# Patient Record
Sex: Female | Born: 1965 | Race: White | Hispanic: Yes | Marital: Single | Smoking: Never smoker
Health system: Southern US, Community
[De-identification: ages and names within clinical notes are randomized; demographics above are authoritative.]

## PROBLEM LIST (undated history)

## (undated) DIAGNOSIS — R350 Frequency of micturition: Secondary | ICD-10-CM

## (undated) DIAGNOSIS — I10 Essential (primary) hypertension: Secondary | ICD-10-CM

## (undated) DIAGNOSIS — E119 Type 2 diabetes mellitus without complications: Secondary | ICD-10-CM

## (undated) DIAGNOSIS — D649 Anemia, unspecified: Secondary | ICD-10-CM

## (undated) DIAGNOSIS — F329 Major depressive disorder, single episode, unspecified: Secondary | ICD-10-CM

## (undated) DIAGNOSIS — F32A Depression, unspecified: Secondary | ICD-10-CM

## (undated) DIAGNOSIS — M1990 Unspecified osteoarthritis, unspecified site: Secondary | ICD-10-CM

## (undated) DIAGNOSIS — T7840XA Allergy, unspecified, initial encounter: Secondary | ICD-10-CM

## (undated) DIAGNOSIS — M2550 Pain in unspecified joint: Secondary | ICD-10-CM

## (undated) DIAGNOSIS — E785 Hyperlipidemia, unspecified: Secondary | ICD-10-CM

## (undated) HISTORY — PX: KNEE SURGERY: SHX244

## (undated) HISTORY — DX: Type 2 diabetes mellitus without complications: E11.9

## (undated) HISTORY — DX: Anemia, unspecified: D64.9

---

## 2009-10-13 ENCOUNTER — Ambulatory Visit: Payer: Self-pay | Admitting: Interventional Radiology

## 2009-10-13 ENCOUNTER — Emergency Department (HOSPITAL_BASED_OUTPATIENT_CLINIC_OR_DEPARTMENT_OTHER): Admission: EM | Admit: 2009-10-13 | Discharge: 2009-10-13 | Payer: Self-pay | Admitting: Emergency Medicine

## 2017-01-07 ENCOUNTER — Other Ambulatory Visit: Payer: Self-pay | Admitting: Orthopedic Surgery

## 2017-01-08 ENCOUNTER — Telehealth: Payer: Self-pay | Admitting: Surgery

## 2017-01-08 NOTE — Telephone Encounter (Signed)
Spoke with Loraine Leriche- WC Adjuster @ [facility] to confirm the patient WC information. Loraine Leriche told me that he is unaware that Dr. Myra Gianotti is assisting the surgery with Dr. Yevette Edwards therefore the patient's office visit here will not be covered. I called Dr. Marshell Levan office back and left a message for Eunice Blase, the office worker's comp coordinator to speak with Loraine Leriche to ensure that Dr. Myra Gianotti is listed as an assistant in this surgery.

## 2017-01-12 ENCOUNTER — Encounter: Payer: Self-pay | Admitting: Surgery

## 2017-01-13 ENCOUNTER — Ambulatory Visit (INDEPENDENT_AMBULATORY_CARE_PROVIDER_SITE_OTHER): Payer: Worker's Compensation | Admitting: Surgery

## 2017-01-13 ENCOUNTER — Encounter: Payer: Self-pay | Admitting: Surgery

## 2017-01-13 VITALS — BP 144/85 | HR 83 | Temp 98.2°F | Resp 18 | Ht 60.0 in | Wt 174.0 lb

## 2017-01-13 DIAGNOSIS — M5440 Lumbago with sciatica, unspecified side: Secondary | ICD-10-CM

## 2017-01-13 NOTE — Progress Notes (Signed)
[facility]  Patient name: Monique Clayton MRN: [id number] DOB: April 18, 1966 Sex: female   REFERRING PROVIDER:    Dr. Yevette Edwards   REASON FOR CONSULT:    Spine exposure  HISTORY OF PRESENT ILLNESS:   Monique Clayton is a 51 y.o. female, who is Here today for discussions regarding anterior exposure of the L5-S1 disc space for instrumentation.  Her history is provided by an interpreter as she is Spanish-speaking.  The patient is on major medical problem is diabetes which is without complication.  She also suffers from chronic anemia.  Her only abdominal surgery is a C-section 2.  PAST MEDICAL HISTORY    Past Medical History:  Diagnosis Date  . Anemia   . Diabetes mellitus without complication (HCC)      FAMILY HISTORY   No family history on file.  SOCIAL HISTORY:   Social History   Social History  . Marital status: Married    Spouse name: N/A  . Number of children: N/A  . Years of education: N/A   Occupational History  . Not on file.   Social History Main Topics  . Smoking status: Never Smoker  . Smokeless tobacco: Never Used  . Alcohol use No  . Drug use: No  . Sexual activity: Not on file   Other Topics Concern  . Not on file   Social History Narrative  . No narrative on file    ALLERGIES:    Allergies  Allergen Reactions  . Canagliflozin Other (See Comments)    Yeast Infection  . Penicillins Other (See Comments)    "Vaginal infection"    CURRENT MEDICATIONS:    Current Outpatient Prescriptions  Medication Sig Dispense Refill  . Dulaglutide (TRULICITY) 1.5 MG/0.5ML SOPN INJECT 1.5 MG UNDER THE SKIN ONCE WEEKLY FOR 30 DAYS    . glipiZIDE-metformin (METAGLIP) 2.5-500 MG tablet TAKE 2 TABLETS BY MOUTH EVERY MORNING AND TAKE 1 TABLET EVERY EVENING    . pioglitazone (ACTOS) 15 MG tablet Take 15 mg by mouth.    . sitaGLIPtin (JANUVIA) 100 MG tablet Take 100 mg by mouth.      No current facility-administered medications for this visit.     REVIEW OF SYSTEMS:    denotes positive finding,  denotes negative finding Cardiac  Comments:  Chest pain or chest pressure:    Shortness of breath upon exertion:    Short of breath when lying flat:    Irregular heart rhythm:        Vascular    Pain in calf, thigh, or hip brought on by ambulation: x   Pain in feet at night that wakes you up from your sleep:     Blood clot in your veins:    Leg swelling:         Pulmonary    Oxygen at home:    Productive cough:     Wheezing:         Neurologic    Sudden weakness in arms or legs:     Sudden numbness in arms or legs:     Sudden onset of difficulty speaking or slurred speech:    Temporary loss of vision in one eye:     Problems with dizziness:         Gastrointestinal    Blood in stool:      Vomited blood:         Genitourinary    Burning when urinating:     Blood in  urine:        Psychiatric    Major depression:         Hematologic    Bleeding problems:    Problems with blood clotting too easily:        Skin    Rashes or ulcers:        Constitutional    Fever or chills:     PHYSICAL EXAM:   Vitals:   01/13/17 1412  BP: (!) 144/85  Pulse: 83  Resp: 18  Temp: 98.2 F (36.8 C)  TempSrc: Oral  SpO2: 98%  Weight: 174 lb (78.9 kg)  Height: 5' (1.524 m)    GENERAL: The patient is a well-nourished female, in no acute distress. The vital signs are documented above. CARDIAC: There is a regular rate and rhythm.  VASCULAR: Palpable pedal pulses bilaterally.  No significant lower extremity edema PULMONARY: Nonlabored respirations ABDOMEN: Soft and non-tender   MUSCULOSKELETAL: There are no major deformities or cyanosis. NEUROLOGIC: No focal weakness or paresthesias are detected. SKIN: There are no ulcers or rashes noted. PSYCHIATRIC: The patient has a normal affect.  STUDIES:   I have reviewed the x-rays she brought with her of her  L-spine and sacrum  ASSESSMENT and PLAN   The patient is scheduled for anterior exposure for instrumentation of the L5-S1 disc space.  Via the Spanish interpreter, I discussed the details of the procedure as well as my involvement in her case.  We discussed the type of incision to be made as well as the potential risk for injury to the iliac artery, iliac vein, and ureter.  All of her questions were answered today and she is ready to proceed with surgery.   Durene Cal, MD [facility] [phone] Pager [phone]

## 2017-01-15 ENCOUNTER — Other Ambulatory Visit: Payer: Self-pay

## 2017-01-18 ENCOUNTER — Encounter: Payer: Self-pay | Admitting: Orthopedic Surgery

## 2017-01-21 ENCOUNTER — Encounter (HOSPITAL_COMMUNITY)
Admission: RE | Admit: 2017-01-21 | Discharge: 2017-01-21 | Disposition: A | Discharge status: Home / Self Care | Payer: Worker's Compensation | Source: Ambulatory Visit | Attending: Orthopedic Surgery | Admitting: Orthopedic Surgery

## 2017-01-21 ENCOUNTER — Encounter (HOSPITAL_COMMUNITY): Payer: Self-pay

## 2017-01-21 ENCOUNTER — Ambulatory Visit (HOSPITAL_COMMUNITY)
Admission: RE | Admit: 2017-01-21 | Discharge: 2017-01-21 | Disposition: A | Discharge status: Home / Self Care | Payer: Worker's Compensation | Source: Ambulatory Visit | Attending: Orthopedic Surgery | Admitting: Orthopedic Surgery

## 2017-01-21 DIAGNOSIS — I10 Essential (primary) hypertension: Secondary | ICD-10-CM | POA: Diagnosis not present

## 2017-01-21 DIAGNOSIS — Z01818 Encounter for other preprocedural examination: Secondary | ICD-10-CM

## 2017-01-21 DIAGNOSIS — E119 Type 2 diabetes mellitus without complications: Secondary | ICD-10-CM | POA: Diagnosis not present

## 2017-01-21 DIAGNOSIS — Z01810 Encounter for preprocedural cardiovascular examination: Secondary | ICD-10-CM | POA: Diagnosis present

## 2017-01-21 DIAGNOSIS — Z01812 Encounter for preprocedural laboratory examination: Secondary | ICD-10-CM | POA: Insufficient documentation

## 2017-01-21 DIAGNOSIS — E785 Hyperlipidemia, unspecified: Secondary | ICD-10-CM | POA: Diagnosis not present

## 2017-01-21 HISTORY — DX: Depression, unspecified: F32.A

## 2017-01-21 HISTORY — DX: Unspecified osteoarthritis, unspecified site: M19.90

## 2017-01-21 HISTORY — DX: Essential (primary) hypertension: I10

## 2017-01-21 HISTORY — DX: Pain in unspecified joint: M25.50

## 2017-01-21 HISTORY — DX: Allergy, unspecified, initial encounter: T78.40XA

## 2017-01-21 HISTORY — DX: Major depressive disorder, single episode, unspecified: F32.9

## 2017-01-21 HISTORY — DX: Frequency of micturition: R35.0

## 2017-01-21 HISTORY — DX: Hyperlipidemia, unspecified: E78.5

## 2017-01-21 LAB — URINALYSIS, ROUTINE W REFLEX MICROSCOPIC
BILIRUBIN URINE: NEGATIVE
Bacteria, UA: NONE SEEN
Glucose, UA: NEGATIVE mg/dL
Hgb urine dipstick: NEGATIVE
KETONES UR: NEGATIVE mg/dL
NITRITE: NEGATIVE
PH: 6 (ref 5.0–8.0)
PROTEIN: NEGATIVE mg/dL
Specific Gravity, Urine: 1.008 (ref 1.005–1.030)

## 2017-01-21 LAB — CBC WITH DIFFERENTIAL/PLATELET
Basophils Absolute: 0 10*3/uL (ref 0.0–0.1)
Basophils Relative: 0 %
EOS ABS: 0.4 10*3/uL (ref 0.0–0.7)
Eosinophils Relative: 4 %
HCT: 41.6 % (ref 36.0–46.0)
Hemoglobin: 14 g/dL (ref 12.0–15.0)
LYMPHS ABS: 3.5 10*3/uL (ref 0.7–4.0)
Lymphocytes Relative: 40 %
MCH: 30 pg (ref 26.0–34.0)
MCHC: 33.7 g/dL (ref 30.0–36.0)
MCV: 89.3 fL (ref 78.0–100.0)
MONO ABS: 0.7 10*3/uL (ref 0.1–1.0)
Monocytes Relative: 8 %
Neutro Abs: 4.1 10*3/uL (ref 1.7–7.7)
Neutrophils Relative %: 48 %
Platelets: 255 10*3/uL (ref 150–400)
RBC: 4.66 MIL/uL (ref 3.87–5.11)
RDW: 12.5 % (ref 11.5–15.5)
WBC: 8.7 10*3/uL (ref 4.0–10.5)

## 2017-01-21 LAB — SURGICAL PCR SCREEN
MRSA, PCR: NEGATIVE
Staphylococcus aureus: NEGATIVE

## 2017-01-21 LAB — TYPE AND SCREEN
ABO/RH(D): A POS
Antibody Screen: NEGATIVE

## 2017-01-21 LAB — COMPREHENSIVE METABOLIC PANEL
ALT: 73 U/L — AB (ref 14–54)
AST: 55 U/L — ABNORMAL HIGH (ref 15–41)
Albumin: 4.2 g/dL (ref 3.5–5.0)
Alkaline Phosphatase: 93 U/L (ref 38–126)
Anion gap: 8 (ref 5–15)
BUN: 14 mg/dL (ref 6–20)
CO2: 27 mmol/L (ref 22–32)
CREATININE: 0.62 mg/dL (ref 0.44–1.00)
Calcium: 9.9 mg/dL (ref 8.9–10.3)
Chloride: 102 mmol/L (ref 101–111)
Glucose, Bld: 148 mg/dL — ABNORMAL HIGH (ref 65–99)
Potassium: 3.8 mmol/L (ref 3.5–5.1)
SODIUM: 137 mmol/L (ref 135–145)
Total Bilirubin: 0.3 mg/dL (ref 0.3–1.2)
Total Protein: 8.1 g/dL (ref 6.5–8.1)

## 2017-01-21 LAB — ABO/RH: ABO/RH(D): A POS

## 2017-01-21 LAB — PROTIME-INR
INR: 1.04
PROTHROMBIN TIME: 13.6 s (ref 11.4–15.2)

## 2017-01-21 LAB — GLUCOSE, CAPILLARY: Glucose-Capillary: 159 mg/dL — ABNORMAL HIGH (ref 65–99)

## 2017-01-21 LAB — APTT: APTT: 31 s (ref 24–36)

## 2017-01-21 MED ORDER — POVIDONE-IODINE 7.5 % EX SOLN: Freq: Once | CUTANEOUS | Status: DC

## 2017-01-21 MED ORDER — CHLORHEXIDINE GLUCONATE 4 % EX LIQD
60.0000 mL | Freq: Once | CUTANEOUS | Status: DC
Start: 2017-01-21 — End: 2017-01-22

## 2017-01-21 MED ORDER — CHLORHEXIDINE GLUCONATE 4 % EX LIQD: 60.0000 mL | Freq: Once | CUTANEOUS | Status: DC

## 2017-01-21 NOTE — Pre-Procedure Instructions (Signed)
Monique Clayton  01/21/2017      Monique Clayton [facility] [place], [place] - [address] [place] [address] Phone: [phone] Fax: [phone]    Your procedure is scheduled on Wed, May 16 @ 8:30 AM  Report to [facility] Admitting at 6:30 AM  Call this number if you have problems the morning of surgery:  [phone]   Remember:  Do not eat food or drink liquids after midnight.              No Goody's,BC's,Aleve,Advil,Motrin,Ibuprofen,Aspirin,Fish Oil,or any Herbal Medication.     How to Manage Your Diabetes Before and After Surgery  Why is it important to control my blood sugar before and after surgery? . Improving blood sugar levels before and after surgery helps healing and can limit problems. . A way of improving blood sugar control is eating a healthy diet by: o  Eating less sugar and carbohydrates o  Increasing activity/exercise o  Talking with your doctor about reaching your blood sugar goals . High blood sugars (greater than 180 mg/dL) can raise your risk of infections and slow your recovery, so you will need to focus on controlling your diabetes during the weeks before surgery. . Make sure that the doctor who takes care of your diabetes knows about your planned surgery including the date and location.  How do I manage my blood sugar before surgery? . Check your blood sugar at least 4 times a day, starting 2 days before surgery, to make sure that the level is not too high or low. o Check your blood sugar the morning of your surgery when you wake up and every 2 hours until you get to the Short Stay unit. . If your blood sugar is less than 70 mg/dL, you will need to treat for low blood sugar: o Do not take insulin. o Treat a low blood sugar (less than 70 mg/dL) with  cup of clear juice (cranberry or apple), 4 glucose tablets, OR glucose gel. o Recheck blood sugar in 15 minutes after treatment (to make sure it is  greater than 70 mg/dL). If your blood sugar is not greater than 70 mg/dL on recheck, call [phone] for further instructions. . Report your blood sugar to the short stay nurse when you get to Short Stay.  . If you are admitted to the hospital after surgery: o Your blood sugar will be checked by the staff and you will probably be given insulin after surgery (instead of oral diabetes medicines) to make sure you have good blood sugar levels. o The goal for blood sugar control after surgery is 80-180 mg/dL.              WHAT DO I DO ABOUT MY DIABETES MEDICATION?   Marland Kitchen Do not take oral diabetes medicines (pills) the morning of surgery.    Reviewed and Endorsed by [facility] Patient Education Committee, August 2015   Do not wear jewelry, make-up or nail polish.  Do not wear lotions, powders, perfumes, or deoderant.  Do not shave 48 hours prior to surgery.   Do not bring valuables to the hospital.  [facility] is not responsible for any belongings or valuables.  Contacts, dentures or bridgework may not be worn into surgery.  Leave your suitcase in the car.  After surgery it may be brought to your room.  For patients admitted to the hospital, discharge time will be determined by your treatment team.  Patients discharged the day of surgery will not be allowed to drive home.    [facility]  Before surgery, you can play an important role.  Because skin is not sterile, your skin needs to be as free of germs as possible.  You can reduce the number of germs on you skin by washing with CHG (chlorahexidine gluconate) soap before surgery.  CHG is an antiseptic cleaner which kills germs and bonds with the skin to continue killing germs even after washing.  Please DO NOT use if you have an allergy to CHG or antibacterial soaps.  If your skin becomes reddened/irritated stop using the CHG and inform your nurse when you arrive at Short Stay.  Do  not shave (including legs and underarms) for at least 48 hours prior to the first CHG shower.  You may shave your face.  Please follow these instructions carefully:   1.  Shower with CHG Soap the night before surgery and the                                morning of Surgery.  2.  If you choose to wash your hair, wash your hair first as usual with your       normal shampoo.  3.  After you shampoo, rinse your hair and body thoroughly to remove the                      Shampoo.  4.  Use CHG as you would any other liquid soap.  You can apply chg directly       to the skin and wash gently with scrungie or a clean washcloth.  5.  Apply the CHG Soap to your body ONLY FROM THE NECK DOWN.        Do not use on open wounds or open sores.  Avoid contact with your eyes,       ears, mouth and genitals (private parts).  Wash genitals (private parts)       with your normal soap.  6.  Wash thoroughly, paying special attention to the area where your surgery        will be performed.  7.  Thoroughly rinse your body with warm water from the neck down.  8.  DO NOT shower/wash with your normal soap after using and rinsing off       the CHG Soap.  9.  Pat yourself dry with a clean towel.            10.  Wear clean pajamas.            11.  Place clean sheets on your bed the night of your first shower and do not        sleep with pets.  Day of Surgery  Do not apply any lotions/deoderants the morning of surgery.  Please wear clean clothes to the hospital/surgery center.   Please read over the following fact sheets that you were given. Pain Booklet, Coughing and Deep Breathing, MRSA Information and Surgical Site Infection Prevention

## 2017-01-21 NOTE — Progress Notes (Signed)
Cardiologist denies having one. States she did see one yrs ago d/t some chest pain.Cardiologist told her it was acid reflux  Medical Md is Dr.Kathleen Rice  Echo/stress test/heart cath denies  EKG denies in past yr  CXR denies in past yr

## 2017-01-22 LAB — HEMOGLOBIN A1C
HEMOGLOBIN A1C: 9.9 % — AB (ref 4.8–5.6)
MEAN PLASMA GLUCOSE: 237 mg/dL

## 2017-01-25 NOTE — H&P (Signed)
PREOPERATIVE H&P  Chief Complaint: Low back pain, bilateral leg pain  HPI: Monique Clayton is a 51 y.o. 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 Allergy    takes Zyrtec daily and uses Eye Drops  . Anemia   . Arthritis   . Depression    hx of-was on meds d/t death of dad in 2014  . Diabetes mellitus without complication (HCC)    takes Meds daily  . Hyperlipidemia    was on Lipitor but has been off for a yr  . Hypertension    was on Losartan but has been off for over a yr  . Joint pain   . Urinary frequency    Past Surgical History:  Procedure Laterality Date  . CESAREAN SECTION     x 2  . KNEE SURGERY Left    Social History   Social History  . Marital status: Single    Spouse name: N/A  . Number of children: N/A  . Years of education: N/A   Social History Main Topics  . Smoking status: Never Smoker  . Smokeless tobacco: Never Used  . Alcohol use No  . Drug use: No  . Sexual activity: Not on file   Other Topics Concern  . Not on file   Social History Narrative  . No narrative on file   No family history on file. Allergies  Allergen Reactions  . Canagliflozin Other (See Comments)    Yeast Infection  . Latex     Rash.Hand swelling  . Penicillins Other (See Comments)    "Vaginal infection"   Prior to Admission medications   Medication Sig Start Date End Date Taking? Authorizing Provider  azelastine (OPTIVAR) 0.05 % ophthalmic solution Place 1 drop into both eyes 2 (two) times daily.   Yes [provider]  Dulaglutide (TRULICITY) 1.5 MG/0.5ML SOPN INJECT 1.5 MG UNDER THE SKIN ONCE WEEKLY FOR 30 DAYS 09/25/16  Yes [provider]    glipiZIDE-metformin (METAGLIP) 2.5-500 MG tablet TAKE 2 TABLETS BY MOUTH EVERY MORNING AND TAKE 1 TABLET EVERY EVENING 12/18/16  Yes [provider]  ibuprofen (ADVIL,MOTRIN) 600 MG tablet Take 600 mg by mouth every 6 (six) hours as needed for moderate pain.   Yes [provider]  methocarbamol (ROBAXIN) 500 MG tablet Take 500 mg by mouth 2 (two) times daily as needed for muscle spasms.   Yes [provider]  montelukast (SINGULAIR) 10 MG tablet Take 10 mg by mouth at bedtime.   Yes [provider]  pioglitazone (ACTOS) 15 MG tablet Take 15 mg by mouth every morning.  12/04/16  Yes [provider]  sitaGLIPtin (JANUVIA) 100 MG tablet Take 100 mg by mouth daily.  06/09/16  Yes [provider]     All other systems have been reviewed and were otherwise negative with the exception of those mentioned in the HPI and as above.  Physical Exam: There were no vitals filed for this visit.  General: Alert, no acute distress Cardiovascular: No pedal edema Respiratory: No cyanosis, no use of accessory musculature Skin: No lesions in the area of chief complaint Neurologic: Sensation intact distally Psychiatric: Patient is competent  for consent with normal mood and affect Lymphatic: No axillary or cervical lymphadenopathy  MUSCULOSKELETAL: + TTP low back  Assessment/Plan: Lumbar radiculopathy Plan for Procedure(s): LUMBAR 5-SACRUM 1 ANTERIOR LUMBAR INTERBODY FUSION LUMBAR 5-SACRUM 1 POSTERIOR LUMBAR FUSION WITH INSTRUMENTATION AND ALLOGRAFT   Emilee Hero, MD 01/25/2017 7:53 AM

## 2017-01-26 NOTE — Anesthesia Preprocedure Evaluation (Addendum)
Anesthesia Evaluation  Patient identified by MRN, date of birth, ID band Patient awake    Reviewed: Allergy & Precautions, NPO status , Patient's Chart, lab work & pertinent test results  Airway Mallampati: II  TM Distance: >3 FB Neck ROM: Full    Dental  (+) Dental Advisory Given, Teeth Intact   Pulmonary neg pulmonary ROS,    Pulmonary exam normal        Cardiovascular hypertension, Pt. on medications  Rhythm:Regular Rate:Normal     Neuro/Psych negative neurological ROS     GI/Hepatic negative GI ROS, Neg liver ROS,   Endo/Other  diabetes, Poorly Controlled, Type 2, Oral Hypoglycemic Agents  Renal/GU      Musculoskeletal  (+) Arthritis ,   Abdominal   Peds  Hematology negative hematology ROS (+)   Anesthesia Other Findings   Reproductive/Obstetrics                           Lab Results  Component Value Date   WBC 8.7 01/21/2017   HGB 14.0 01/21/2017   HCT 41.6 01/21/2017   MCV 89.3 01/21/2017   PLT 255 01/21/2017   Lab Results  Component Value Date   CREATININE 0.62 01/21/2017   BUN 14 01/21/2017   NA 137 01/21/2017   K 3.8 01/21/2017   CL 102 01/21/2017   CO2 27 01/21/2017   Lab Results  Component Value Date   INR 1.04 01/21/2017    Anesthesia Physical Anesthesia Plan  ASA: II  Anesthesia Plan: General   Post-op Pain Management:    Induction: Intravenous  Airway Management Planned: Oral ETT  Additional Equipment: Arterial line  Intra-op Plan:   Post-operative Plan: Extubation in OR  Informed Consent: I have reviewed the patients History and Physical, chart, labs and discussed the procedure including the risks, benefits and alternatives for the proposed anesthesia with the patient or authorized representative who has indicated his/her understanding and acceptance.   Dental advisory given  Plan Discussed with: CRNA  Anesthesia Plan Comments:        Anesthesia Quick Evaluation

## 2017-01-26 NOTE — Progress Notes (Signed)
Anesthesia Chart Review:  Pt is a 51 year old female scheduled for L5-S1 ALIF, L5-S1 PLIF, abdominal exposure on 01/27/2017 with Estill BambergMark Dumonski, MD and Coral ElseVance Brabham, MD  - PCP is Montey HoraKathleen Rice, MD (notes in care everywhere)  PMH includes:  HTN, DM, hyperlipidemia, anemia. Never smoker. BMI 34  Medications include: dulaglutide, glipizide-metformin, pioglitazone, sitagliptin  Preoperative labs reviewed.  HbA1c 9.9, glucose 148.   - DM poorly controlled. A1c 9.3 on 12/04/16 and 8.7 on 08/15/16.   CXR 01/21/17: Negative.  No active cardiopulmonary disease.  EKG 01/21/17: NSR. Minimal voltage criteria for LVH, may be normal variant  I spoke with patient's PCP Dr. Dimple Caseyice by telephone about patient's uncontrolled DM. She felt increase in A1c is likely due to steroid injections patient has been receiving. She recommended the patient be placed on sliding scale insulin while in the hospital and receive diabetes education while in the hospital. She will follow up with the patient after discharge.  Rica Mastngela Rashay Barnette, FNP-BC [facility] Phone: [phone] 01/26/2017 5:06 PM

## 2017-01-27 ENCOUNTER — Inpatient Hospital Stay (HOSPITAL_COMMUNITY): Payer: Worker's Compensation

## 2017-01-27 ENCOUNTER — Inpatient Hospital Stay (HOSPITAL_COMMUNITY)
Admission: RE | Admit: 2017-01-27 | Discharge: 2017-01-29 | DRG: 460 | Disposition: A | Discharge status: Home / Self Care | Payer: Worker's Compensation | Source: Ambulatory Visit | Attending: Orthopedic Surgery | Admitting: Orthopedic Surgery

## 2017-01-27 ENCOUNTER — Inpatient Hospital Stay (HOSPITAL_COMMUNITY): Payer: Worker's Compensation | Admitting: Anesthesiology

## 2017-01-27 ENCOUNTER — Encounter (HOSPITAL_COMMUNITY): Payer: Self-pay | Admitting: Anesthesiology

## 2017-01-27 ENCOUNTER — Inpatient Hospital Stay (HOSPITAL_COMMUNITY)
Admission: RE | Disposition: A | Discharge status: Home / Self Care | Payer: Self-pay | Source: Ambulatory Visit | Attending: Orthopedic Surgery

## 2017-01-27 DIAGNOSIS — Z7984 Long term (current) use of oral hypoglycemic drugs: Secondary | ICD-10-CM

## 2017-01-27 DIAGNOSIS — M1990 Unspecified osteoarthritis, unspecified site: Secondary | ICD-10-CM | POA: Diagnosis present

## 2017-01-27 DIAGNOSIS — M5410 Radiculopathy, site unspecified: Secondary | ICD-10-CM | POA: Diagnosis present

## 2017-01-27 DIAGNOSIS — M4727 Other spondylosis with radiculopathy, lumbosacral region: Secondary | ICD-10-CM | POA: Diagnosis present

## 2017-01-27 DIAGNOSIS — D649 Anemia, unspecified: Secondary | ICD-10-CM | POA: Diagnosis present

## 2017-01-27 DIAGNOSIS — Z419 Encounter for procedure for purposes other than remedying health state, unspecified: Secondary | ICD-10-CM

## 2017-01-27 DIAGNOSIS — Z888 Allergy status to other drugs, medicaments and biological substances status: Secondary | ICD-10-CM

## 2017-01-27 DIAGNOSIS — Z79899 Other long term (current) drug therapy: Secondary | ICD-10-CM

## 2017-01-27 DIAGNOSIS — M79604 Pain in right leg: Secondary | ICD-10-CM | POA: Diagnosis present

## 2017-01-27 DIAGNOSIS — M5137 Other intervertebral disc degeneration, lumbosacral region: Secondary | ICD-10-CM

## 2017-01-27 DIAGNOSIS — Z91040 Latex allergy status: Secondary | ICD-10-CM

## 2017-01-27 DIAGNOSIS — E119 Type 2 diabetes mellitus without complications: Secondary | ICD-10-CM | POA: Diagnosis present

## 2017-01-27 DIAGNOSIS — M79605 Pain in left leg: Secondary | ICD-10-CM | POA: Diagnosis present

## 2017-01-27 HISTORY — PX: ABDOMINAL EXPOSURE: SHX5708

## 2017-01-27 HISTORY — PX: ANTERIOR LUMBAR FUSION: SHX1170

## 2017-01-27 LAB — GLUCOSE, CAPILLARY
GLUCOSE-CAPILLARY: 161 mg/dL — AB (ref 65–99)
GLUCOSE-CAPILLARY: 242 mg/dL — AB (ref 65–99)
GLUCOSE-CAPILLARY: 328 mg/dL — AB (ref 65–99)
Glucose-Capillary: 250 mg/dL — ABNORMAL HIGH (ref 65–99)

## 2017-01-27 SURGERY — ANTERIOR LUMBAR FUSION 1 LEVEL
Anesthesia: General

## 2017-01-27 MED ORDER — ONDANSETRON HCL 4 MG/2ML IJ SOLN
INTRAMUSCULAR | Status: AC
  Filled 2017-01-27: qty 2

## 2017-01-27 MED ORDER — FENTANYL CITRATE (PF) 250 MCG/5ML IJ SOLN
INTRAMUSCULAR | Status: AC
  Filled 2017-01-27: qty 5

## 2017-01-27 MED ORDER — MIDAZOLAM HCL 5 MG/5ML IJ SOLN
INTRAMUSCULAR | Status: DC | PRN
  Administered 2017-01-27: 2 mg via INTRAVENOUS

## 2017-01-27 MED ORDER — METFORMIN HCL 500 MG PO TABS: 500.0000 mg | ORAL_TABLET | Freq: Two times a day (BID) | ORAL | Status: DC

## 2017-01-27 MED ORDER — ACETAMINOPHEN 10 MG/ML IV SOLN
1000.0000 mg | Freq: Once | INTRAVENOUS | Status: AC
  Administered 2017-01-27: 1000 mg via INTRAVENOUS
  Filled 2017-01-27: qty 100

## 2017-01-27 MED ORDER — CEFAZOLIN SODIUM 1 G IJ SOLR
INTRAMUSCULAR | Status: AC
  Filled 2017-01-27: qty 20

## 2017-01-27 MED ORDER — DEXAMETHASONE SODIUM PHOSPHATE 10 MG/ML IJ SOLN
INTRAMUSCULAR | Status: DC | PRN
  Administered 2017-01-27: 10 mg via INTRAVENOUS

## 2017-01-27 MED ORDER — MIDAZOLAM HCL 2 MG/2ML IJ SOLN
INTRAMUSCULAR | Status: AC
  Filled 2017-01-27: qty 2

## 2017-01-27 MED ORDER — OXYCODONE HCL 5 MG PO TABS
5.0000 mg | ORAL_TABLET | Freq: Once | ORAL | Status: AC
  Administered 2017-01-27: 5 mg via ORAL

## 2017-01-27 MED ORDER — INSULIN ASPART 100 UNIT/ML ~~LOC~~ SOLN
0.0000 [IU] | Freq: Three times a day (TID) | SUBCUTANEOUS | Status: DC
  Administered 2017-01-28 (×2): 5 [IU] via SUBCUTANEOUS
  Administered 2017-01-28 – 2017-01-29 (×3): 3 [IU] via SUBCUTANEOUS

## 2017-01-27 MED ORDER — METFORMIN HCL 500 MG PO TABS
1000.0000 mg | ORAL_TABLET | Freq: Every day | ORAL | Status: DC
  Administered 2017-01-28 – 2017-01-29 (×2): 1000 mg via ORAL
  Filled 2017-01-27 (×2): qty 2

## 2017-01-27 MED ORDER — HYDROMORPHONE HCL 1 MG/ML IJ SOLN
0.2500 mg | INTRAMUSCULAR | Status: DC | PRN
  Administered 2017-01-27 (×3): 0.5 mg via INTRAVENOUS

## 2017-01-27 MED ORDER — GLIPIZIDE 2.5 MG HALF TABLET
2.5000 mg | ORAL_TABLET | Freq: Two times a day (BID) | ORAL | Status: DC
  Filled 2017-01-27: qty 2

## 2017-01-27 MED ORDER — THROMBIN 20000 UNITS EX KIT
PACK | CUTANEOUS | Status: DC | PRN
  Administered 2017-01-27: 20000 [IU] via TOPICAL

## 2017-01-27 MED ORDER — INSULIN ASPART 100 UNIT/ML ~~LOC~~ SOLN
SUBCUTANEOUS | Status: AC
  Administered 2017-01-27: 5 [IU]
  Filled 2017-01-27: qty 1

## 2017-01-27 MED ORDER — LIDOCAINE HCL (CARDIAC) 20 MG/ML IV SOLN
INTRAVENOUS | Status: DC | PRN
  Administered 2017-01-27: 60 mg via INTRAVENOUS

## 2017-01-27 MED ORDER — CEFAZOLIN SODIUM-DEXTROSE 2-4 GM/100ML-% IV SOLN
2.0000 g | Freq: Three times a day (TID) | INTRAVENOUS | Status: AC
  Administered 2017-01-27 – 2017-01-28 (×2): 2 g via INTRAVENOUS
  Filled 2017-01-27 (×2): qty 100

## 2017-01-27 MED ORDER — SUGAMMADEX SODIUM 200 MG/2ML IV SOLN
INTRAVENOUS | Status: DC | PRN
  Administered 2017-01-27: 150 mg via INTRAVENOUS

## 2017-01-27 MED ORDER — THROMBIN 20000 UNITS EX SOLR
CUTANEOUS | Status: AC
  Filled 2017-01-27: qty 20000

## 2017-01-27 MED ORDER — HYDROXYZINE HCL 50 MG/ML IM SOLN
50.0000 mg | Freq: Once | INTRAMUSCULAR | Status: AC
  Administered 2017-01-27: 50 mg via INTRAMUSCULAR
  Filled 2017-01-27: qty 1

## 2017-01-27 MED ORDER — LACTATED RINGERS IV SOLN
INTRAVENOUS | Status: DC | PRN
  Administered 2017-01-27 (×3): via INTRAVENOUS

## 2017-01-27 MED ORDER — PANTOPRAZOLE SODIUM 40 MG IV SOLR
40.0000 mg | Freq: Every day | INTRAVENOUS | Status: DC
  Administered 2017-01-27: 40 mg via INTRAVENOUS
  Filled 2017-01-27 (×2): qty 40

## 2017-01-27 MED ORDER — OXYCODONE HCL 5 MG PO TABS
ORAL_TABLET | ORAL | Status: AC
  Administered 2017-01-27: 5 mg
  Filled 2017-01-27: qty 1

## 2017-01-27 MED ORDER — ALUM & MAG HYDROXIDE-SIMETH 200-200-20 MG/5ML PO SUSP: 30.0000 mL | Freq: Four times a day (QID) | ORAL | Status: DC | PRN

## 2017-01-27 MED ORDER — GLIPIZIDE 5 MG PO TABS
5.0000 mg | ORAL_TABLET | Freq: Every day | ORAL | Status: DC
  Filled 2017-01-27 (×2): qty 1

## 2017-01-27 MED ORDER — HEMOSTATIC AGENTS (NO CHARGE) OPTIME
TOPICAL | Status: DC | PRN
  Administered 2017-01-27: 1 via TOPICAL

## 2017-01-27 MED ORDER — LACTATED RINGERS IV SOLN
INTRAVENOUS | Status: DC | PRN
  Administered 2017-01-27 (×2): via INTRAVENOUS

## 2017-01-27 MED ORDER — ROCURONIUM BROMIDE 100 MG/10ML IV SOLN
INTRAVENOUS | Status: DC | PRN
  Administered 2017-01-27: 20 mg via INTRAVENOUS
  Administered 2017-01-27 (×2): 10 mg via INTRAVENOUS
  Administered 2017-01-27 (×2): 20 mg via INTRAVENOUS
  Administered 2017-01-27: 50 mg via INTRAVENOUS
  Administered 2017-01-27: 20 mg via INTRAVENOUS

## 2017-01-27 MED ORDER — KETOTIFEN FUMARATE 0.025 % OP SOLN
1.0000 [drp] | Freq: Two times a day (BID) | OPHTHALMIC | Status: DC
  Filled 2017-01-27: qty 5

## 2017-01-27 MED ORDER — MENTHOL 3 MG MT LOZG: 1.0000 | LOZENGE | OROMUCOSAL | Status: DC | PRN

## 2017-01-27 MED ORDER — ACETAMINOPHEN 325 MG PO TABS: 650.0000 mg | ORAL_TABLET | ORAL | Status: DC | PRN

## 2017-01-27 MED ORDER — HYDROMORPHONE HCL 1 MG/ML IJ SOLN
INTRAMUSCULAR | Status: AC
  Filled 2017-01-27: qty 0.5

## 2017-01-27 MED ORDER — MORPHINE SULFATE (PF) 2 MG/ML IV SOLN: 1.0000 mg | INTRAVENOUS | Status: DC | PRN

## 2017-01-27 MED ORDER — LIDOCAINE 2% (20 MG/ML) 5 ML SYRINGE
INTRAMUSCULAR | Status: AC
  Filled 2017-01-27: qty 5

## 2017-01-27 MED ORDER — PHENYLEPHRINE HCL 10 MG/ML IJ SOLN
INTRAMUSCULAR | Status: DC | PRN
  Administered 2017-01-27 (×4): 40 ug via INTRAVENOUS
  Administered 2017-01-27: 80 ug via INTRAVENOUS
  Administered 2017-01-27 (×4): 40 ug via INTRAVENOUS

## 2017-01-27 MED ORDER — PHENYLEPHRINE 40 MCG/ML (10ML) SYRINGE FOR IV PUSH (FOR BLOOD PRESSURE SUPPORT)
PREFILLED_SYRINGE | INTRAVENOUS | Status: AC
  Filled 2017-01-27: qty 10

## 2017-01-27 MED ORDER — DULAGLUTIDE 1.5 MG/0.5ML ~~LOC~~ SOAJ: Freq: Every day | SUBCUTANEOUS | Status: DC

## 2017-01-27 MED ORDER — SUGAMMADEX SODIUM 200 MG/2ML IV SOLN
INTRAVENOUS | Status: AC
  Filled 2017-01-27: qty 2

## 2017-01-27 MED ORDER — OXYCODONE-ACETAMINOPHEN 5-325 MG PO TABS
1.0000 | ORAL_TABLET | ORAL | Status: DC | PRN
  Administered 2017-01-27: 1 via ORAL
  Administered 2017-01-28 (×2): 2 via ORAL
  Administered 2017-01-28: 1 via ORAL
  Administered 2017-01-28: 2 via ORAL
  Administered 2017-01-28: 1 via ORAL
  Administered 2017-01-29 (×2): 2 via ORAL
  Filled 2017-01-27 (×3): qty 2
  Filled 2017-01-27: qty 1
  Filled 2017-01-27 (×2): qty 2
  Filled 2017-01-27 (×2): qty 1

## 2017-01-27 MED ORDER — ZOLPIDEM TARTRATE 5 MG PO TABS: 5.0000 mg | ORAL_TABLET | Freq: Every evening | ORAL | Status: DC | PRN

## 2017-01-27 MED ORDER — DEXAMETHASONE SODIUM PHOSPHATE 10 MG/ML IJ SOLN
INTRAMUSCULAR | Status: AC
  Filled 2017-01-27: qty 1

## 2017-01-27 MED ORDER — SODIUM CHLORIDE 0.9% FLUSH: 3.0000 mL | INTRAVENOUS | Status: DC | PRN

## 2017-01-27 MED ORDER — MONTELUKAST SODIUM 10 MG PO TABS
10.0000 mg | ORAL_TABLET | Freq: Every day | ORAL | Status: DC
  Administered 2017-01-28 (×2): 10 mg via ORAL
  Filled 2017-01-27 (×2): qty 1

## 2017-01-27 MED ORDER — BUPIVACAINE LIPOSOME 1.3 % IJ SUSP
20.0000 mL | INTRAMUSCULAR | Status: DC
  Filled 2017-01-27: qty 20

## 2017-01-27 MED ORDER — PROPOFOL 10 MG/ML IV BOLUS
INTRAVENOUS | Status: AC
  Filled 2017-01-27: qty 20

## 2017-01-27 MED ORDER — PROPOFOL 10 MG/ML IV BOLUS
INTRAVENOUS | Status: DC | PRN
  Administered 2017-01-27: 150 mg via INTRAVENOUS

## 2017-01-27 MED ORDER — PHENOL 1.4 % MT LIQD: 1.0000 | OROMUCOSAL | Status: DC | PRN

## 2017-01-27 MED ORDER — INSULIN ASPART 100 UNIT/ML ~~LOC~~ SOLN
0.0000 [IU] | Freq: Three times a day (TID) | SUBCUTANEOUS | Status: DC
  Administered 2017-01-27: 11 [IU] via SUBCUTANEOUS

## 2017-01-27 MED ORDER — POTASSIUM CHLORIDE IN NACL 20-0.9 MEQ/L-% IV SOLN: INTRAVENOUS | Status: DC

## 2017-01-27 MED ORDER — BISACODYL 5 MG PO TBEC: 5.0000 mg | DELAYED_RELEASE_TABLET | Freq: Every day | ORAL | Status: DC | PRN

## 2017-01-27 MED ORDER — HYDROMORPHONE HCL 1 MG/ML IJ SOLN
INTRAMUSCULAR | Status: AC
  Filled 2017-01-27: qty 1

## 2017-01-27 MED ORDER — BUPIVACAINE LIPOSOME 1.3 % IJ SUSP
INTRAMUSCULAR | Status: DC | PRN
  Administered 2017-01-27: 20 mL

## 2017-01-27 MED ORDER — INSULIN ASPART 100 UNIT/ML ~~LOC~~ SOLN: 5.0000 [IU] | Freq: Once | SUBCUTANEOUS | Status: DC

## 2017-01-27 MED ORDER — PROMETHAZINE HCL 25 MG/ML IJ SOLN: 6.2500 mg | INTRAMUSCULAR | Status: DC | PRN

## 2017-01-27 MED ORDER — MORPHINE SULFATE (PF) 4 MG/ML IV SOLN: 1.0000 mg | INTRAVENOUS | Status: DC | PRN

## 2017-01-27 MED ORDER — BUPIVACAINE-EPINEPHRINE 0.25% -1:200000 IJ SOLN
INTRAMUSCULAR | Status: DC | PRN
  Administered 2017-01-27: 20 mL

## 2017-01-27 MED ORDER — CEFAZOLIN SODIUM-DEXTROSE 2-4 GM/100ML-% IV SOLN
2.0000 g | INTRAVENOUS | Status: AC
  Administered 2017-01-27 (×2): 2 g via INTRAVENOUS

## 2017-01-27 MED ORDER — PIOGLITAZONE HCL 15 MG PO TABS
15.0000 mg | ORAL_TABLET | ORAL | Status: DC
  Administered 2017-01-28 – 2017-01-29 (×2): 15 mg via ORAL
  Filled 2017-01-27 (×2): qty 1

## 2017-01-27 MED ORDER — SODIUM CHLORIDE 0.9% FLUSH
3.0000 mL | Freq: Two times a day (BID) | INTRAVENOUS | Status: DC
  Administered 2017-01-27: 3 mL via INTRAVENOUS

## 2017-01-27 MED ORDER — EPINEPHRINE PF 1 MG/ML IJ SOLN
INTRAMUSCULAR | Status: AC
  Filled 2017-01-27: qty 1

## 2017-01-27 MED ORDER — SENNOSIDES-DOCUSATE SODIUM 8.6-50 MG PO TABS: 1.0000 | ORAL_TABLET | Freq: Every evening | ORAL | Status: DC | PRN

## 2017-01-27 MED ORDER — DOCUSATE SODIUM 100 MG PO CAPS
100.0000 mg | ORAL_CAPSULE | Freq: Two times a day (BID) | ORAL | Status: DC
  Administered 2017-01-27 – 2017-01-29 (×4): 100 mg via ORAL
  Filled 2017-01-27 (×4): qty 1

## 2017-01-27 MED ORDER — ONDANSETRON HCL 4 MG PO TABS: 4.0000 mg | ORAL_TABLET | Freq: Four times a day (QID) | ORAL | Status: DC | PRN

## 2017-01-27 MED ORDER — ONDANSETRON HCL 4 MG/2ML IJ SOLN
4.0000 mg | Freq: Four times a day (QID) | INTRAMUSCULAR | Status: DC | PRN
  Administered 2017-01-27: 4 mg via INTRAVENOUS
  Filled 2017-01-27: qty 2

## 2017-01-27 MED ORDER — DIAZEPAM 5 MG PO TABS
5.0000 mg | ORAL_TABLET | Freq: Four times a day (QID) | ORAL | Status: DC | PRN
  Administered 2017-01-28 – 2017-01-29 (×5): 5 mg via ORAL
  Filled 2017-01-27 (×5): qty 1

## 2017-01-27 MED ORDER — 0.9 % SODIUM CHLORIDE (POUR BTL) OPTIME
TOPICAL | Status: DC | PRN
  Administered 2017-01-27 (×3): 1000 mL

## 2017-01-27 MED ORDER — PROPOFOL 500 MG/50ML IV EMUL
INTRAVENOUS | Status: DC | PRN
  Administered 2017-01-27: 11:00:00 via INTRAVENOUS
  Administered 2017-01-27: 75 ug/kg/min via INTRAVENOUS

## 2017-01-27 MED ORDER — ACETAMINOPHEN 650 MG RE SUPP: 650.0000 mg | RECTAL | Status: DC | PRN

## 2017-01-27 MED ORDER — FENTANYL CITRATE (PF) 100 MCG/2ML IJ SOLN
INTRAMUSCULAR | Status: DC | PRN
  Administered 2017-01-27 (×7): 50 ug via INTRAVENOUS
  Administered 2017-01-27: 100 ug via INTRAVENOUS
  Administered 2017-01-27: 50 ug via INTRAVENOUS

## 2017-01-27 MED ORDER — SODIUM CHLORIDE 0.9 % IV SOLN: 250.0000 mL | INTRAVENOUS | Status: DC

## 2017-01-27 MED ORDER — GLIPIZIDE 2.5 MG HALF TABLET
2.5000 mg | ORAL_TABLET | Freq: Every day | ORAL | Status: DC
  Administered 2017-01-28: 2.5 mg via ORAL
  Filled 2017-01-27 (×2): qty 1

## 2017-01-27 MED ORDER — BUPIVACAINE HCL (PF) 0.25 % IJ SOLN
INTRAMUSCULAR | Status: AC
  Filled 2017-01-27: qty 30

## 2017-01-27 MED ORDER — METFORMIN HCL 500 MG PO TABS
500.0000 mg | ORAL_TABLET | Freq: Every day | ORAL | Status: DC
  Filled 2017-01-27: qty 1

## 2017-01-27 MED ORDER — GLIPIZIDE-METFORMIN HCL 2.5-500 MG PO TABS: 1.0000 | ORAL_TABLET | Freq: Two times a day (BID) | ORAL | Status: DC

## 2017-01-27 MED ORDER — FLEET ENEMA 7-19 GM/118ML RE ENEM: 1.0000 | ENEMA | Freq: Once | RECTAL | Status: DC | PRN

## 2017-01-27 MED ORDER — ROCURONIUM BROMIDE 10 MG/ML (PF) SYRINGE
PREFILLED_SYRINGE | INTRAVENOUS | Status: AC
  Filled 2017-01-27: qty 10

## 2017-01-27 MED ORDER — LINAGLIPTIN 5 MG PO TABS
5.0000 mg | ORAL_TABLET | Freq: Every day | ORAL | Status: DC
  Administered 2017-01-28 – 2017-01-29 (×2): 5 mg via ORAL
  Filled 2017-01-27 (×3): qty 1

## 2017-01-27 MED ORDER — ONDANSETRON HCL 4 MG/2ML IJ SOLN
INTRAMUSCULAR | Status: DC | PRN
  Administered 2017-01-27: 4 mg via INTRAVENOUS

## 2017-01-27 MED ORDER — METHYLENE BLUE 0.5 % INJ SOLN
INTRAVENOUS | Status: AC
  Filled 2017-01-27: qty 10

## 2017-01-27 MED ORDER — INSULIN ASPART 100 UNIT/ML ~~LOC~~ SOLN
0.0000 [IU] | Freq: Every day | SUBCUTANEOUS | Status: DC
  Administered 2017-01-27 – 2017-01-28 (×2): 2 [IU] via SUBCUTANEOUS

## 2017-01-27 SURGICAL SUPPLY — 146 items
ADH SKN CLS APL DERMABOND .7 (GAUZE/BANDAGES/DRESSINGS) ×1
APL SKNCLS STERI-STRIP NONHPOA (GAUZE/BANDAGES/DRESSINGS) ×2
APPLIER CLIP 11 MED OPEN (CLIP) ×6
APR CLP MED 11 20 MLT OPN (CLIP) ×2
BENZOIN TINCTURE PRP APPL 2/3 (GAUZE/BANDAGES/DRESSINGS) ×5 IMPLANT
BLADE CLIPPER SURG (BLADE) IMPLANT
BLADE SURG 10 STRL SS (BLADE) ×3 IMPLANT
BONE VIVIGEN FORMABLE 10CC (Bone Implant) ×3 IMPLANT
BUR PRESCISION 1.7 ELITE (BURR) ×3 IMPLANT
BUR ROUND PRECISION 4.0 (BURR) IMPLANT
BUR ROUND PRECISION 4.0MM (BURR)
BUR SABER RD CUTTING 3.0 (BURR) IMPLANT
BUR SABER RD CUTTING 3.0MM (BURR)
CAGE COUGAR MED 14MMX5 (Cage) ×1 IMPLANT
CAGE COUGAR MED 14X5 (Cage) ×1 IMPLANT
CARTRIDGE OIL MAESTRO DRILL (MISCELLANEOUS) ×1 IMPLANT
CLIP APPLIE 11 MED OPEN (CLIP) ×2 IMPLANT
CLIP TI MEDIUM 24 (CLIP) ×3 IMPLANT
CLIP TI WIDE RED SMALL 24 (CLIP) ×3 IMPLANT
CLOSURE WOUND 1/2 X4 (GAUZE/BANDAGES/DRESSINGS) ×3
CONT SPEC 4OZ CLIKSEAL STRL BL (MISCELLANEOUS) ×3 IMPLANT
CORDS BIPOLAR (ELECTRODE) ×3 IMPLANT
COVER BACK TABLE 60X90IN (DRAPES) ×3 IMPLANT
COVER MAYO STAND STRL (DRAPES) ×6 IMPLANT
COVER SURGICAL LIGHT HANDLE (MISCELLANEOUS) ×5 IMPLANT
DERMABOND ADVANCED (GAUZE/BANDAGES/DRESSINGS) ×2
DERMABOND ADVANCED .7 DNX12 (GAUZE/BANDAGES/DRESSINGS) ×1 IMPLANT
DIFFUSER DRILL AIR PNEUMATIC (MISCELLANEOUS) ×3 IMPLANT
DRAIN CHANNEL 15F RND FF W/TCR (WOUND CARE) IMPLANT
DRAPE C-ARM 42X72 X-RAY (DRAPES) ×6 IMPLANT
DRAPE C-ARMOR (DRAPES) ×2 IMPLANT
DRAPE INCISE IOBAN 66X45 STRL (DRAPES) ×3 IMPLANT
DRAPE POUCH INSTRU U-SHP 10X18 (DRAPES) ×3 IMPLANT
DRAPE SURG 17X23 STRL (DRAPES) ×12 IMPLANT
DRSG MEPILEX BORDER 4X12 (GAUZE/BANDAGES/DRESSINGS) ×3 IMPLANT
DURAPREP 26ML APPLICATOR (WOUND CARE) ×3 IMPLANT
ELECT BLADE 4.0 EZ CLEAN MEGAD (MISCELLANEOUS) ×3
ELECT BLADE 6.5 EXT (BLADE) ×2 IMPLANT
ELECT CAUTERY BLADE 6.4 (BLADE) ×3 IMPLANT
ELECT REM PT RETURN 9FT ADLT (ELECTROSURGICAL) ×3
ELECTRODE BLDE 4.0 EZ CLN MEGD (MISCELLANEOUS) ×1 IMPLANT
ELECTRODE REM PT RTRN 9FT ADLT (ELECTROSURGICAL) ×1 IMPLANT
EVACUATOR SILICONE 100CC (DRAIN) IMPLANT
FEE INTRAOP MONITOR IMPULS NCS (MISCELLANEOUS) IMPLANT
GAUZE SPONGE 4X4 12PLY STRL (GAUZE/BANDAGES/DRESSINGS) ×3 IMPLANT
GAUZE SPONGE 4X4 16PLY XRAY LF (GAUZE/BANDAGES/DRESSINGS) ×5 IMPLANT
GLOVE BIO SURGEON STRL SZ7 (GLOVE) ×1 IMPLANT
GLOVE BIO SURGEON STRL SZ8 (GLOVE) ×3 IMPLANT
GLOVE BIOGEL PI IND STRL 7.0 (GLOVE) ×1 IMPLANT
GLOVE BIOGEL PI IND STRL 7.5 (GLOVE) ×1 IMPLANT
GLOVE BIOGEL PI IND STRL 8 (GLOVE) ×2 IMPLANT
GLOVE BIOGEL PI INDICATOR 7.0 (GLOVE) ×2
GLOVE BIOGEL PI INDICATOR 7.5 (GLOVE) ×2
GLOVE BIOGEL PI INDICATOR 8 (GLOVE) ×4
GLOVE SURG SS PI 6.5 STRL IVOR (GLOVE) ×10 IMPLANT
GLOVE SURG SS PI 7.0 STRL IVOR (GLOVE) ×2 IMPLANT
GLOVE SURG SS PI 7.5 STRL IVOR (GLOVE) ×5 IMPLANT
GLOVE SURG SS PI 8.0 STRL IVOR (GLOVE) ×8 IMPLANT
GOWN STRL REUS W/ TWL LRG LVL3 (GOWN DISPOSABLE) ×4 IMPLANT
GOWN STRL REUS W/ TWL XL LVL3 (GOWN DISPOSABLE) ×2 IMPLANT
GOWN STRL REUS W/TWL LRG LVL3 (GOWN DISPOSABLE) ×12
GOWN STRL REUS W/TWL XL LVL3 (GOWN DISPOSABLE) ×6
GRAFT BNE MATRIX VG FRMBL L 10 (Bone Implant) IMPLANT
GUIDEWIRE BLUNT VIPER II 1.45 (WIRE) ×2 IMPLANT
GUIDEWIRE SHARP VIPER II (WIRE) ×8 IMPLANT
HEMOSTAT SURGICEL 2X14 (HEMOSTASIS) IMPLANT
INSERT FOGARTY 61MM (MISCELLANEOUS) IMPLANT
INSERT FOGARTY SM (MISCELLANEOUS) IMPLANT
INTRAOP MONITOR FEE IMPULS NCS (MISCELLANEOUS) ×1
INTRAOP MONITOR FEE IMPULSE (MISCELLANEOUS) ×2
IV CATH 14GX2 1/4 (CATHETERS) ×3 IMPLANT
KIT BASIN OR (CUSTOM PROCEDURE TRAY) ×3 IMPLANT
KIT POSITION SURG JACKSON T1 (MISCELLANEOUS) ×3 IMPLANT
KIT ROOM TURNOVER OR (KITS) ×6 IMPLANT
LOOP VESSEL MAXI BLUE (MISCELLANEOUS) ×3 IMPLANT
LOOP VESSEL MINI RED (MISCELLANEOUS) ×3 IMPLANT
MARKER SKIN DUAL TIP RULER LAB (MISCELLANEOUS) ×3 IMPLANT
NDL HYPO 25GX1X1/2 BEV (NEEDLE) ×1 IMPLANT
NDL JAMSHIDI VIPER (NEEDLE) IMPLANT
NDL SAFETY ECLIPSE 18X1.5 (NEEDLE) ×1 IMPLANT
NDL SPNL 18GX3.5 QUINCKE PK (NEEDLE) ×2 IMPLANT
NEEDLE 22X1 1/2 (OR ONLY) (NEEDLE) ×3 IMPLANT
NEEDLE HYPO 18GX1.5 SHARP (NEEDLE) ×3
NEEDLE HYPO 25GX1X1/2 BEV (NEEDLE) ×3 IMPLANT
NEEDLE JAMSHIDI VIPER (NEEDLE) ×6 IMPLANT
NEEDLE SPNL 18GX3.5 QUINCKE PK (NEEDLE) ×6 IMPLANT
NS IRRIG 1000ML POUR BTL (IV SOLUTION) ×7 IMPLANT
OIL CARTRIDGE MAESTRO DRILL (MISCELLANEOUS)
PACK LAMINECTOMY ORTHO (CUSTOM PROCEDURE TRAY) ×3 IMPLANT
PACK UNIVERSAL I (CUSTOM PROCEDURE TRAY) ×5 IMPLANT
PAD ARMBOARD 7.5X6 YLW CONV (MISCELLANEOUS) ×12 IMPLANT
PATTIES SURGICAL .5 X1 (DISPOSABLE) ×3 IMPLANT
PATTIES SURGICAL .5X1.5 (GAUZE/BANDAGES/DRESSINGS) ×3 IMPLANT
PENCIL BUTTON HOLSTER BLD 10FT (ELECTRODE) ×3 IMPLANT
PROBE PEDCLE PROBE MAGSTM DISP (MISCELLANEOUS) ×3 IMPLANT
ROD VIPER2 5.5X45 PRE LARDOSED (Rod) ×4 IMPLANT
SCREW POLY VIPER2 7X40MM (Screw) ×8 IMPLANT
SCREW SET SINGLE INNER MIS (Screw) ×8 IMPLANT
SPONGE INTESTINAL PEANUT (DISPOSABLE) ×10 IMPLANT
SPONGE LAP 18X18 X RAY DECT (DISPOSABLE) IMPLANT
SPONGE LAP 4X18 X RAY DECT (DISPOSABLE) IMPLANT
SPONGE SURGIFOAM ABS GEL 100 (HEMOSTASIS) ×6 IMPLANT
STRIP CLOSURE SKIN 1/2X4 (GAUZE/BANDAGES/DRESSINGS) ×5 IMPLANT
SURGIFLO W/THROMBIN 8M KIT (HEMOSTASIS) IMPLANT
SUT MNCRL AB 4-0 PS2 18 (SUTURE) ×10 IMPLANT
SUT PDS AB 1 CTX 36 (SUTURE) ×9 IMPLANT
SUT PROLENE 4 0 RB 1 (SUTURE) ×12
SUT PROLENE 4-0 RB1 .5 CRCL 36 (SUTURE) ×4 IMPLANT
SUT PROLENE 5 0 C 1 24 (SUTURE) IMPLANT
SUT PROLENE 5 0 CC1 (SUTURE) IMPLANT
SUT PROLENE 6 0 C 1 30 (SUTURE) ×3 IMPLANT
SUT PROLENE 6 0 CC (SUTURE) IMPLANT
SUT SILK 0 TIES 10X30 (SUTURE) ×3 IMPLANT
SUT SILK 2 0 TIES 10X30 (SUTURE) ×6 IMPLANT
SUT SILK 2 0SH CR/8 30 (SUTURE) IMPLANT
SUT SILK 3 0 TIES 10X30 (SUTURE) ×6 IMPLANT
SUT SILK 3 0SH CR/8 30 (SUTURE) IMPLANT
SUT VIC AB 0 CT1 18XCR BRD 8 (SUTURE) ×1 IMPLANT
SUT VIC AB 0 CT1 27 (SUTURE) ×3
SUT VIC AB 0 CT1 27XBRD ANBCTR (SUTURE) ×1 IMPLANT
SUT VIC AB 0 CT1 8-18 (SUTURE) ×3
SUT VIC AB 1 CT1 18XCR BRD 8 (SUTURE) ×1 IMPLANT
SUT VIC AB 1 CT1 27 (SUTURE) ×6
SUT VIC AB 1 CT1 27XBRD ANBCTR (SUTURE) ×2 IMPLANT
SUT VIC AB 1 CT1 8-18 (SUTURE) ×3
SUT VIC AB 1 CTX 36 (SUTURE) ×6
SUT VIC AB 1 CTX36XBRD ANBCTR (SUTURE) ×2 IMPLANT
SUT VIC AB 2-0 CT1 27 (SUTURE) ×3
SUT VIC AB 2-0 CT1 TAPERPNT 27 (SUTURE) ×1 IMPLANT
SUT VIC AB 2-0 CT2 18 VCP726D (SUTURE) ×3 IMPLANT
SUT VIC AB 3-0 SH 27 (SUTURE) ×3
SUT VIC AB 3-0 SH 27X BRD (SUTURE) ×1 IMPLANT
SYR 20CC LL (SYRINGE) ×3 IMPLANT
SYR BULB IRRIGATION 50ML (SYRINGE) ×3 IMPLANT
SYR CONTROL 10ML LL (SYRINGE) ×8 IMPLANT
SYR TB 1ML LUER SLIP (SYRINGE) ×3 IMPLANT
TAP CANN VIPER2 DL 5.0 (TAP) ×4 IMPLANT
TAP CANN VIPER2 DL 6.0 (TAP) ×4 IMPLANT
TAPE CLOTH SURG 6X10 WHT LF (GAUZE/BANDAGES/DRESSINGS) ×2 IMPLANT
TOWEL OR 17X24 6PK STRL BLUE (TOWEL DISPOSABLE) ×3 IMPLANT
TOWEL OR 17X26 10 PK STRL BLUE (TOWEL DISPOSABLE) ×3 IMPLANT
TRAY FOLEY CATH SILVER 16FR (SET/KITS/TRAYS/PACK) ×1 IMPLANT
TRAY FOLEY CATH SILVER 16FR LF (SET/KITS/TRAYS/PACK) ×2 IMPLANT
TRAY FOLEY W/METER SILVER 16FR (SET/KITS/TRAYS/PACK) ×1 IMPLANT
WATER STERILE IRR 1000ML POUR (IV SOLUTION) ×3 IMPLANT
YANKAUER SUCT BULB TIP NO VENT (SUCTIONS) ×3 IMPLANT

## 2017-01-27 NOTE — Transfer of Care (Signed)
Immediate Anesthesia Transfer of Care Note  Patient: Monique Clayton  Procedure(s) Performed: Procedure(s) with comments: LUMBAR 5-SACRUM 1 ANTERIOR LUMBAR INTERBODY FUSION (N/A) - LUMBAR 5-SACRUM 1 ANTERIOR LUMBAR INTERBODY FUSION  LUMBAR 5-SACRUM 1 POSTERIOR LUMBAR FUSION WITH INSTRUMENTATION AND ALLOGRAFT (N/A) - LUMBAR 5-SACRUM 1 POSTERIOR LUMBAR FUSION WITH INSTRUMENTATION AND ALLOGRAFT ABDOMINAL EXPOSURE (N/A)  Patient Location: PACU  Anesthesia Type:General  Level of Consciousness: awake, oriented and patient cooperative  Airway & Oxygen Therapy: Patient Spontanous Breathing and Patient connected to face mask oxygen  Post-op Assessment: Report given to RN and Post -op Vital signs reviewed and stable  Post vital signs: Reviewed  Last Vitals:  Vitals:   01/27/17 0657 01/27/17 1340  BP: 137/70 117/65  Pulse: 77 93  Resp: 18 12  Temp: 37.1 C 36.6 C    Last Pain:  Vitals:   01/27/17 0657  TempSrc: Oral         Complications: No apparent anesthesia complications

## 2017-01-27 NOTE — Anesthesia Procedure Notes (Signed)
Procedure Name: Intubation Date/Time: 01/27/2017 8:42 AM Performed by: Lovie CholOCK, Elmin Wiederholt K Pre-anesthesia Checklist: Patient identified, Emergency Drugs available, Suction available and Patient being monitored Patient Re-evaluated:Patient Re-evaluated prior to inductionOxygen Delivery Method: Circle System Utilized Preoxygenation: Pre-oxygenation with 100% oxygen Intubation Type: IV induction Ventilation: Mask ventilation without difficulty and Oral airway inserted - appropriate to patient size Laryngoscope Size: Miller and 2 Grade View: Grade I Tube type: Oral Tube size: 7.5 mm Number of attempts: 1 Airway Equipment and Method: Stylet and Oral airway Placement Confirmation: ETT inserted through vocal cords under direct vision,  positive ETCO2 and breath sounds checked- equal and bilateral Secured at: 21 cm Tube secured with: Tape Dental Injury: Teeth and Oropharynx as per pre-operative assessment

## 2017-01-27 NOTE — Interval H&P Note (Signed)
History and Physical Interval Note:  01/27/2017 8:06 AM  Nathanial RancherMarcelina G Favila  has presented today for surgery, with the diagnosis of Lumbar radiculopathy  The various methods of treatment have been discussed with the patient and family. After consideration of risks, benefits and other options for treatment, the patient has consented to  Procedure(s) with comments: LUMBAR 5-SACRUM 1 ANTERIOR LUMBAR INTERBODY FUSION (N/A) - LUMBAR 5-SACRUM 1 ANTERIOR LUMBAR INTERBODY FUSION  LUMBAR 5-SACRUM 1 POSTERIOR LUMBAR FUSION WITH INSTRUMENTATION AND ALLOGRAFT (N/A) - LUMBAR 5-SACRUM 1 POSTERIOR LUMBAR FUSION WITH INSTRUMENTATION AND ALLOGRAFT ABDOMINAL EXPOSURE (N/A) as a surgical intervention .  The patient's history has been reviewed, patient examined, no change in status, stable for surgery.  I have reviewed the patient's chart and labs.  Questions were answered to the patient's satisfaction.     Durene CalBrabham, [place]

## 2017-01-27 NOTE — Anesthesia Postprocedure Evaluation (Signed)
Anesthesia Post Note  Patient: Monique Clayton  Procedure(s) Performed: Procedure(s) (LRB): LUMBAR 5-SACRUM 1 ANTERIOR LUMBAR INTERBODY FUSION (N/A) LUMBAR 5-SACRUM 1 POSTERIOR LUMBAR FUSION WITH INSTRUMENTATION AND ALLOGRAFT (N/A) ABDOMINAL EXPOSURE (N/A)  Patient location during evaluation: PACU Anesthesia Type: General Level of consciousness: awake and alert Pain management: pain level controlled Vital Signs Assessment: post-procedure vital signs reviewed and stable Respiratory status: spontaneous breathing, nonlabored ventilation, respiratory function stable and patient connected to nasal cannula oxygen Cardiovascular status: blood pressure returned to baseline and stable Postop Assessment: no signs of nausea or vomiting Anesthetic complications: no       Last Vitals:  Vitals:   01/27/17 1510 01/27/17 1540  BP: 133/72 123/64  Pulse: 96 95  Resp: 14 10  Temp:      Last Pain:  Vitals:   01/27/17 1540  TempSrc:   PainSc: Ardean LarsenAsleep                 Yetzali Weld E

## 2017-01-27 NOTE — H&P (View-Only) (Signed)
[facility]  Patient name: Monique Clayton MRN: [id number] DOB: March 05, 1966 Sex: female   REFERRING PROVIDER:    Dr. Yevette Edwardsumonski   REASON FOR CONSULT:    Spine exposure  HISTORY OF PRESENT ILLNESS:   Monique MeekMarcelina Calligan is a 51 y.o. female, who is Here today for discussions regarding anterior exposure of the L5-S1 disc space for instrumentation.  Her history is provided by an interpreter as she is Spanish-speaking.  The patient is on major medical problem is diabetes which is without complication.  She also suffers from chronic anemia.  Her only abdominal surgery is a C-section 2.  PAST MEDICAL HISTORY    Past Medical History:  Diagnosis Date  . Anemia   . Diabetes mellitus without complication (HCC)      FAMILY HISTORY   No family history on file.  SOCIAL HISTORY:   Social History   Social History  . Marital status: Married    Spouse name: N/A  . Number of children: N/A  . Years of education: N/A   Occupational History  . Not on file.   Social History Main Topics  . Smoking status: Never Smoker  . Smokeless tobacco: Never Used  . Alcohol use No  . Drug use: No  . Sexual activity: Not on file   Other Topics Concern  . Not on file   Social History Narrative  . No narrative on file    ALLERGIES:    Allergies  Allergen Reactions  . Canagliflozin Other (See Comments)    Yeast Infection  . Penicillins Other (See Comments)    "Vaginal infection"    CURRENT MEDICATIONS:    Current Outpatient Prescriptions  Medication Sig Dispense Refill  . Dulaglutide (TRULICITY) 1.5 MG/0.5ML SOPN INJECT 1.5 MG UNDER THE SKIN ONCE WEEKLY FOR 30 DAYS    . glipiZIDE-metformin (METAGLIP) 2.5-500 MG tablet TAKE 2 TABLETS BY MOUTH EVERY MORNING AND TAKE 1 TABLET EVERY EVENING    . pioglitazone (ACTOS) 15 MG tablet Take 15 mg by mouth.    . sitaGLIPtin (JANUVIA) 100 MG tablet Take 100 mg by mouth.      No current facility-administered medications for this visit.     REVIEW OF SYSTEMS:   [X]  denotes positive finding, [ ]  denotes negative finding Cardiac  Comments:  Chest pain or chest pressure:    Shortness of breath upon exertion:    Short of breath when lying flat:    Irregular heart rhythm:        Vascular    Pain in calf, thigh, or hip brought on by ambulation: x   Pain in feet at night that wakes you up from your sleep:     Blood clot in your veins:    Leg swelling:         Pulmonary    Oxygen at home:    Productive cough:     Wheezing:         Neurologic    Sudden weakness in arms or legs:     Sudden numbness in arms or legs:     Sudden onset of difficulty speaking or slurred speech:    Temporary loss of vision in one eye:     Problems with dizziness:         Gastrointestinal    Blood in stool:      Vomited blood:         Genitourinary    Burning when urinating:     Blood in  urine:        Psychiatric    Major depression:         Hematologic    Bleeding problems:    Problems with blood clotting too easily:        Skin    Rashes or ulcers:        Constitutional    Fever or chills:     PHYSICAL EXAM:   Vitals:   01/13/17 1412  BP: (!) 144/85  Pulse: 83  Resp: 18  Temp: 98.2 F (36.8 C)  TempSrc: Oral  SpO2: 98%  Weight: 174 lb (78.9 kg)  Height: 5' (1.524 m)    GENERAL: The patient is a well-nourished female, in no acute distress. The vital signs are documented above. CARDIAC: There is a regular rate and rhythm.  VASCULAR: Palpable pedal pulses bilaterally.  No significant lower extremity edema PULMONARY: Nonlabored respirations ABDOMEN: Soft and non-tender   MUSCULOSKELETAL: There are no major deformities or cyanosis. NEUROLOGIC: No focal weakness or paresthesias are detected. SKIN: There are no ulcers or rashes noted. PSYCHIATRIC: The patient has a normal affect.  STUDIES:   I have reviewed the x-rays she brought with her of her  L-spine and sacrum  ASSESSMENT and PLAN   The patient is scheduled for anterior exposure for instrumentation of the L5-S1 disc space.  Via the Spanish interpreter, I discussed the details of the procedure as well as my involvement in her case.  We discussed the type of incision to be made as well as the potential risk for injury to the iliac artery, iliac vein, and ureter.  All of her questions were answered today and she is ready to proceed with surgery.   Wells Marquest Gunkel, MD [facility] Tel [phone] Pager [phone] 

## 2017-01-28 LAB — GLUCOSE, CAPILLARY
GLUCOSE-CAPILLARY: 218 mg/dL — AB (ref 65–99)
GLUCOSE-CAPILLARY: 209 mg/dL — AB (ref 65–99)
GLUCOSE-CAPILLARY: 248 mg/dL — AB (ref 65–99)

## 2017-01-28 MED ORDER — LIVING WELL WITH DIABETES BOOK - IN SPANISH
Freq: Once | Status: DC
  Filled 2017-01-28: qty 1

## 2017-01-28 MED ORDER — LIVING WELL WITH DIABETES BOOK
Freq: Once | Status: AC
  Administered 2017-01-28: 14:00:00
  Filled 2017-01-28: qty 1

## 2017-01-28 MED FILL — Thrombin For Soln 20000 Unit: CUTANEOUS | Qty: 1 | Status: AC

## 2017-01-28 MED FILL — Heparin Sodium (Porcine) Inj 1000 Unit/ML: INTRAMUSCULAR | Qty: 30 | Status: AC

## 2017-01-28 MED FILL — Sodium Chloride IV Soln 0.9%: INTRAVENOUS | Qty: 1000 | Status: AC

## 2017-01-28 NOTE — Progress Notes (Signed)
    Subjective  - POD #1  Doesn't feel that bad this am Pain ok   Physical Exam:  Abdomen soft Dressing dry Palpable left pedal pulses       Assessment/Plan:  POD #1  Stable from exposure perspective Please call with any concerns  Monique Clayton, Wells 01/28/2017 7:35 AM --  Vitals:   01/28/17 0005 01/28/17 0415  BP: (!) 100/45 (!) 105/56  Pulse: 99 80  Resp: 20 17  Temp: 98.9 F (37.2 C) 98.9 F (37.2 C)    Intake/Output Summary (Last 24 hours) at 01/28/17 0735 Last data filed at 01/28/17 0400  Gross per 24 hour  Intake             3680 ml  Output             1100 ml  Net             2580 ml     Laboratory CBC    Component Value Date/Time   WBC 8.7 01/21/2017 1547   HGB 14.0 01/21/2017 1547   HCT 41.6 01/21/2017 1547   PLT 255 01/21/2017 1547    BMET    Component Value Date/Time   NA 137 01/21/2017 1547   K 3.8 01/21/2017 1547   CL 102 01/21/2017 1547   CO2 27 01/21/2017 1547   GLUCOSE 148 (H) 01/21/2017 1547   BUN 14 01/21/2017 1547   CREATININE 0.62 01/21/2017 1547   CALCIUM 9.9 01/21/2017 1547   GFRNONAA >60 01/21/2017 1547   GFRAA >60 01/21/2017 1547    COAG Lab Results  Component Value Date   INR 1.04 01/21/2017   No results found for: PTT  Antibiotics Anti-infectives    Start     Dose/Rate Route Frequency Ordered Stop   01/27/17 1700  ceFAZolin (ANCEF) IVPB 2g/100 mL premix     2 g 200 mL/hr over 30 Minutes Intravenous Every 8 hours 01/27/17 1654 01/28/17 0045   01/27/17 0635  ceFAZolin (ANCEF) IVPB 2g/100 mL premix     2 g 200 mL/hr over 30 Minutes Intravenous On call to O.R. 01/27/17 [id number] 01/27/17 1245       Monique Clayton Monique Clayton IV, M.D. [facility] [place] Office: [phone] Pager:  [phone]

## 2017-01-28 NOTE — Evaluation (Addendum)
Physical Therapy Evaluation Patient Details Name: Monique Clayton MRN: [id number] DOB: 1966-04-14 Today's Date: 01/28/2017   History of Present Illness  Pt is a 51 y/o female who presents s/p L5-S1 ALIF on 01/27/17.  Clinical Impression  Pt admitted with above diagnosis. Pt currently with functional limitations due to the deficits listed below (see PT Problem List). At the time of PT eval pt was able to perform transfers and ambulation with min guard assist for balance support and safety during standing activity. May require increased assist for bed mobility. Discussed need for step-stool with high bed at home, but do not feel pt needs a hospital bed. Pt and son were educated on entrance into high bed, and when follow-up therapy may be appropriate. Recommendation from PT is for no PT follow up initially, with possible transition to outpatient when/if MD feels it is appropriate per post-op protocol. Do feel pt will benefit from another therapy session tomorrow prior to d/c as she was not able to complete stair training today. Pt will benefit from skilled PT to increase their independence and safety with mobility to allow discharge to the venue listed below.       Follow Up Recommendations No PT follow up;Supervision for mobility/OOB    Equipment Recommendations  Rolling walker with 5" wheels;3in1 (PT) (Step stool for bed)    Recommendations for Other Services       Precautions / Restrictions Precautions Precautions: Fall;Back Precaution Booklet Issued: Yes (comment) Precaution Comments: Pt was cued for precautions during functional mobility. Handout provided by OT. Required Braces or Orthoses: Spinal Brace Spinal Brace: Thoracolumbosacral orthotic;Applied in sitting position Restrictions Weight Bearing Restrictions: No      Mobility  Bed Mobility Overal bed mobility: Needs Assistance             General bed mobility comments: Pt received ambulating with OT. Practiced  getting on/off an elevated bed to simulate home environment, however pt declined practicing log roll. Will need a step-stool at home to be able to enter bed.   Transfers Overall transfer level: Needs assistance Equipment used: Rolling walker (2 wheeled) Transfers: Sit to/from Stand Sit to Stand: Min guard         General transfer comment: VC's for hand placement on seated surface for safety.   Ambulation/Gait Ambulation/Gait assistance: Min guard Ambulation Distance (Feet): 100 Feet Assistive device: Rolling walker (2 wheeled) Gait Pattern/deviations: Step-through pattern;Decreased stride length;Trunk flexed Gait velocity: Decreased Gait velocity interpretation: Below normal speed for age/gender General Gait Details: Pt was able to ambulate slowly but without LOB. Increased pain reported in L buttock and L hip during ambulation.   Stairs            Wheelchair Mobility    Modified Rankin (Stroke Patients Only)       Balance Overall balance assessment: Needs assistance Sitting-balance support: Feet supported;No upper extremity supported Sitting balance-Leahy Scale: Fair     Standing balance support: No upper extremity supported;During functional activity Standing balance-Leahy Scale: Poor Standing balance comment: Required UE support to maintain balance during dynamic standing activity.                              Pertinent Vitals/Pain Pain Assessment: Faces Faces Pain Scale: Hurts even more Pain Location: Incision site Pain Descriptors / Indicators: Operative site guarding;Discomfort Pain Intervention(s): Limited activity within patient's tolerance;Monitored during session;Repositioned    Home Living Family/patient expects to be discharged to:: Private residence  Living Arrangements: Children Available Help at Discharge: Family;Available PRN/intermittently Type of Home: House Home Access: Stairs to enter   Entrance Stairs-Number of Steps:  4 Home Layout: One level Home Equipment: None      Prior Function Level of Independence: Independent               Hand Dominance        Extremity/Trunk Assessment   Upper Extremity Assessment Upper Extremity Assessment: Defer to OT evaluation    Lower Extremity Assessment Lower Extremity Assessment: Generalized weakness;LLE deficits/detail LLE Deficits / Details: Pt reports increased pain in LLE and L buttock during ambulation    Cervical / Trunk Assessment Cervical / Trunk Assessment: Other exceptions Cervical / Trunk Exceptions: s/p surgery  Communication   Communication: Prefers language other than [place] (Spoke Spanish some with son, however no interpreter needed)  Cognition Arousal/Alertness: Awake/alert Behavior During Therapy: WFL for tasks assessed/performed Overall Cognitive Status: Within Functional Limits for tasks assessed                                        General Comments      Exercises     Assessment/Plan    PT Assessment Patient needs continued PT services  PT Problem List Decreased strength;Decreased range of motion;Decreased activity tolerance;Decreased balance;Decreased mobility;Decreased knowledge of use of DME;Decreased safety awareness;Decreased knowledge of precautions;Pain       PT Treatment Interventions DME instruction;Gait training;Stair training;Functional mobility training;Therapeutic activities;Therapeutic exercise;Neuromuscular re-education;Patient/family education    PT Goals (Current goals can be found in the Care Plan section)  Acute Rehab PT Goals Patient Stated Goal: To be safe at home PT Goal Formulation: With patient/family Time For Goal Achievement: 02/04/17 Potential to Achieve Goals: Good    Frequency Min 5X/week   Barriers to discharge        Co-evaluation               AM-PAC PT "6 Clicks" Daily Activity  Outcome Measure Difficulty turning over in bed (including adjusting  bedclothes, sheets and blankets)?: Total Difficulty moving from lying on back to sitting on the side of the bed? : Total Difficulty sitting down on and standing up from a chair with arms (e.g., wheelchair, bedside commode, etc,.)?: Total Help needed moving to and from a bed to chair (including a wheelchair)?: A Little Help needed walking in hospital room?: A Little Help needed climbing 3-5 steps with a railing? : A Lot 6 Click Score: 11    End of Session Equipment Utilized During Treatment: Back brace Activity Tolerance: Patient limited by pain Patient left: with family/visitor present;Other (comment) (In bathroom with OT present) Nurse Communication: Mobility status PT Visit Diagnosis: Unsteadiness on feet (R26.81);Pain Pain - part of body:  (Back)    Time: 1610-9604 PT Time Calculation (min) (ACUTE ONLY): 23 min   Charges:   PT Evaluation $PT Eval Moderate Complexity: 1 Procedure PT Treatments $Gait Training: 8-22 mins   PT G Codes:        Conni Slipper, PT, DPT Acute Rehabilitation Services Pager: [phone]   Marylynn Pearson 01/28/2017, 9:49 AM

## 2017-01-28 NOTE — Evaluation (Signed)
Occupational Therapy Evaluation Patient Details Name: Monique Clayton MRN: [id number] DOB: 06-30-1966 Today's Date: 01/28/2017    History of Present Illness Pt is a 51 y/o female who presents s/p L5-S1 ALIF on 01/27/17.   Clinical Impression   PTA, pt was independent with ADL and functional mobility. She currently requires min assist for shower transfers, min guard assist for toilet transfers, and mod assist for UB and LB dressing. She lives with her sons but they will be at work for the majority of the day. Pt would benefit from continued OT services while admitted to improve independence with ADL and functional mobility in order to ensure safety post-acute D/C. Educated pt/family concerning compensatory strategies for dressing, bathing, toileting, and oral care at sink and they verbalize understanding. Initiated education concerning use of AE for LB ADL but limited due to pt fatigue. In order to maximize safety, feel pt would benefit from further OT session tomorrow morning prior to D/C home. OT will continue to follow acutely.    Follow Up Recommendations  No OT follow up;Supervision - Intermittent    Equipment Recommendations  3 in 1 bedside commode (RW - 2 wheeled)    Recommendations for Other Services       Precautions / Restrictions Precautions Precautions: Fall;Back Precaution Booklet Issued: Yes (comment) Precaution Comments: Verbally reviewed precautions.  Required Braces or Orthoses: Spinal Brace Spinal Brace: Thoracolumbosacral orthotic;Applied in sitting position Restrictions Weight Bearing Restrictions: No      Mobility Bed Mobility Overal bed mobility: Needs Assistance Bed Mobility: Rolling;Sit to Sidelying Rolling: Supervision       Sit to sidelying: Min guard General bed mobility comments: Returned after PT session to discuss LB AE and assisted pt to return to bed.  Transfers Overall transfer level: Needs assistance Equipment used: Rolling walker (2  wheeled) Transfers: Sit to/from Stand Sit to Stand: Min guard         General transfer comment: VC's for hand placement on seated surface for safety.     Balance Overall balance assessment: Needs assistance Sitting-balance support: Feet supported;No upper extremity supported Sitting balance-Leahy Scale: Fair     Standing balance support: No upper extremity supported;During functional activity Standing balance-Leahy Scale: Poor Standing balance comment: Required UE support to maintain balance during dynamic standing activity.                            ADL either performed or assessed with clinical judgement   ADL Overall ADL's : Needs assistance/impaired Eating/Feeding: Set up;Sitting   Grooming: Min guard;Standing   Upper Body Bathing: Sitting;Minimal assistance   Lower Body Bathing: Sit to/from stand;Moderate assistance   Upper Body Dressing : Moderate assistance;Sitting Upper Body Dressing Details (indicate cue type and reason): Mod assist for brace  Lower Body Dressing: Moderate assistance;Sit to/from stand   Toilet Transfer: Min guard;Ambulation;RW;BSC   Toileting- Architect and Hygiene: Min guard;Sit to/from stand   Tub/ Shower Transfer: Minimal assistance;Rolling walker;Walk-in shower Tub/Shower Transfer Details (indicate cue type and reason): Min assist for safety with VC's for technique. Functional mobility during ADLs: Min guard;Minimal assistance;Rolling walker General ADL Comments: Pt educated concerning safety post-acute D/C including compensatory strategies for dressing/bathing, toileting, and 2 cup method for oral care at sink. Initiated education concerning use of AE for LB ADL and will continue to provide education next session as pt fatigued at end of session.      Vision Patient Visual Report: No change from baseline Vision  Assessment?: No apparent visual deficits     Perception     Praxis      Pertinent Vitals/Pain Pain  Assessment: Faces Faces Pain Scale: Hurts even more Pain Location: Incision site Pain Descriptors / Indicators: Operative site guarding;Discomfort Pain Intervention(s): Limited activity within patient's tolerance;Monitored during session;Repositioned     Hand Dominance     Extremity/Trunk Assessment Upper Extremity Assessment Upper Extremity Assessment: Defer to OT evaluation   Lower Extremity Assessment Lower Extremity Assessment: Generalized weakness;LLE deficits/detail LLE Deficits / Details: Pt reports increased pain in LLE and L buttock during ambulation   Cervical / Trunk Assessment Cervical / Trunk Assessment: Other exceptions Cervical / Trunk Exceptions: s/p surgery   Communication Communication Communication: Prefers language other than [place] (Spoke Spanish some with son; however, no interpreter needed)   Cognition Arousal/Alertness: Awake/alert Behavior During Therapy: WFL for tasks assessed/performed Overall Cognitive Status: Within Functional Limits for tasks assessed                                     General Comments       Exercises     Shoulder Instructions      Home Living Family/patient expects to be discharged to:: Private residence Living Arrangements: Children Available Help at Discharge: Family;Available PRN/intermittently Type of Home: House Home Access: Stairs to enter Entergy CorporationEntrance Stairs-Number of Steps: 4   Home Layout: One level     Bathroom Shower/Tub: Producer, television/film/videoWalk-in shower   Bathroom Toilet: Handicapped height Bathroom Accessibility: Yes How Accessible: Accessible via walker Home Equipment: None          Prior Functioning/Environment Level of Independence: Independent                 OT Problem List: Decreased strength;Decreased range of motion;Decreased activity tolerance;Impaired balance (sitting and/or standing);Decreased safety awareness;Decreased knowledge of use of DME or AE;Decreased knowledge of  precautions;Pain      OT Treatment/Interventions: Self-care/ADL training;Therapeutic exercise;Energy conservation;DME and/or AE instruction;Therapeutic activities;Patient/family education;Balance training    OT Goals(Current goals can be found in the care plan section) Acute Rehab OT Goals Patient Stated Goal: To be safe at home OT Goal Formulation: With patient/family Time For Goal Achievement: 02/11/17 Potential to Achieve Goals: Good  OT Frequency: Min 2X/week   Barriers to D/C:            Co-evaluation              AM-PAC PT "6 Clicks" Daily Activity     Outcome Measure Help from another person eating meals?: None Help from another person taking care of personal grooming?: A Little Help from another person toileting, which includes using toliet, bedpan, or urinal?: A Little Help from another person bathing (including washing, rinsing, drying)?: A Lot Help from another person to put on and taking off regular upper body clothing?: A Little Help from another person to put on and taking off regular lower body clothing?: A Lot 6 Click Score: 17   End of Session Equipment Utilized During Treatment: Back brace;Rolling walker Nurse Communication: Mobility status  Activity Tolerance: Patient tolerated treatment well Patient left: in bed;with call bell/phone within reach;with family/visitor present (on return after PT session assisted back to bed)  OT Visit Diagnosis: Other abnormalities of gait and mobility (R26.89);Pain Pain - Right/Left: Left Pain - part of body: Leg (back)                Time:  1610-9604 OT Time Calculation (min): 25 min Charges:  OT General Charges $OT Visit: 1 Procedure OT Evaluation $OT Eval Moderate Complexity: 1 Procedure OT Treatments $Self Care/Home Management : 8-22 mins G-Codes:     Doristine Section, MS OTR/L  Pager: [phone]   Chandel Zaun A Jarry Manon 01/28/2017, 10:38 AM

## 2017-01-28 NOTE — Progress Notes (Signed)
Inpatient Diabetes Program Recommendations  AACE/ADA: New Consensus Statement on Inpatient Glycemic Control (2015)  Target Ranges:  Prepandial:   less than 140 mg/dL      Peak postprandial:   less than 180 mg/dL (1-2 hours)      Critically ill patients:  140 - 180 mg/dL   Lab Results  Component Value Date   GLUCAP 218 (H) 01/28/2017   HGBA1C 9.9 (H) 01/21/2017    Review of Glycemic Control  Inpatient Diabetes Program Recommendations:   Spoke with Monique Clayton and son (patient speaks English clearly but requests son to interpret for her when discussing diabetes) about A1C 9.9 (average CBG 237 over the past 2-3 months time) and explained what an A1C is, basic pathophysiology of DM Type 2, basic home care, basic diabetes diet nutrition principles, importance of checking CBGs and maintaining good CBG control to prevent long-term and short-term complications. Reviewed signs and symptoms of hyperglycemia and hypoglycemia and how to treat hypoglycemia at home. Also reviewed blood sugar goals at home.  RNs to provide ongoing basic DM education at bedside with this patient. Have ordered educational booklet in Spanish. Patient and son very engaged in discussion regarding diabetes.  Thank you, Billy FischerJudy E. Larenda Reedy, RN, MSN, CDE  Diabetes Coordinator Inpatient Glycemic Control Team Team Pager [phone] (8am-5pm) 01/28/2017 2:14 PM

## 2017-01-28 NOTE — Care Management Note (Signed)
Case Management Note  Patient Details  Name: STEPHANNY TSUTSUI MRN: [id number] Date of Birth: 21-Dec-1965  Subjective/Objective:                    Action/Plan: Patient's workers comp case manager :  Sheryl Long   Phone [phone]   Ms Long called requesting orders for elevated toilet seat, walker , step stool with 2 steps and rails, and sock kit .  Called Dr Lynann Bologna office for signature . Left message with Karla [phone]  Faxed orders to Ms Long   Expected Discharge Date:  01/29/17               Expected Discharge Plan:  Home/Self Care  In-House Referral:     Discharge planning Services  CM Consult  Post Acute Care Choice:  Durable Medical Equipment Choice offered to:     DME Arranged:  Walker rolling DME Agency:     HH Arranged:    HH Agency:     Status of Service:  In process, will continue to follow  If discussed at Long Length of Stay Meetings, dates discussed:    Additional Comments:  Marilu Favre, RN 01/28/2017, 3:34 PM

## 2017-01-28 NOTE — Op Note (Signed)
[name]:  Mclean, Shawndra         ACCOUNT NO.:  [id number]  MEDICAL RECORD NO.:  [id number]  LOCATION:                                 FACILITY:  PHYSICIAN:  Estill BambergMark Tineshia Becraft, MD           DATE OF BIRTH:  DATE OF PROCEDURE:  01/27/2017                              OPERATIVE REPORT   PREOPERATIVE DIAGNOSES: 1. Grade 1 L5-S1 spondylolisthesis. 2. Bilateral L5 pars defect. 3. Low back pain and bilateral leg pain.  POSTOPERATIVE DIAGNOSES: 1. Grade 1 L5-S1 spondylolisthesis. 2. Bilateral L5 pars defect. 3. Low back pain and bilateral leg pain.  PROCEDURES: 1. Anterior lumbar interbody fusion, L5-S1. 2. Insertion of interbody device x1 (14 mm, medium, Cougar     intervertebral spacer with 5 degrees of lordosis). 3. Use of morselized allograft - ViviGen. 4. Placement of anterior instrumentation, L5, S1. 5. Intraoperative use of fluoroscopy. 6. Posterior spinal fusion, L5-S1. 7. Placement of posterior instrumentation, L5, S1. 8. Co-surgeon with Dr. Juleen ChinaV. Wells Brabham for the anterior retroperitoneal     exposure.  SURGEON:  Estill BambergMark Ashlynne Shetterly, MD.  ASSISTANJason Coop:  Kayla McKenzie, PA-C.  ANESTHESIA:  General endotracheal anesthesia.  COMPLICATIONS:  None.  DISPOSITION:  Stable.  ESTIMATED BLOOD LOSS:  Minimal.  INDICATIONS FOR SURGERY:  Briefly, Mr. Monique Clayton is a pleasant 51 year old female, who did initially present to me on May 29, 2016, with severe pain in her low back and bilateral leg, status post a work injury that did occur on March 19, 2016.  The patient's imaging studies did reveal the findings outlined above.  Given the patient's ongoing pain and dysfunction with lack of improvement with appropriate conservative treatment measures, we did discuss proceeding with the procedure reflected above.  The patient was fully aware of the risks and limitations of surgery and did elect to proceed.  The patient did understand the risk of a nonunion, which she did understand  was increased, given her history of diabetes.  OPERATIVE DETAILS:  On Jan 27, 2017, the patient was brought to surgery and general endotracheal anesthesia was administered.  The patient was placed supine on a hospital bed.  All bony prominences were padded.  The abdomen was prepped and draped in the usual sterile fashion.  Neurologic monitoring leads were set up by the monitoring technician.  A retroperitoneal exposure was then performed by Dr. Juleen ChinaV. Wells Brabham.  I did function as his co-surgeon for the exposure.  The L5-S1 intervertebral space was appropriately visualized.  I then proceeded with a thorough and complete L5-S1 intervertebral diskectomy.  The diskectomy was completed to the level of the posterior annulus.  The endplates were prepared and the appropriate size interbody spacer was packed with ViviGen and tamped into position in the usual fashion, liberally using AP and lateral fluoroscopy.  I was very pleased with the final resting position of the implant.  I then proceeded with the instrumentation.  I did use an awl to prepare a hole for screw that was inserted into the anterior and superior aspect of the S1 vertebral body. An anterior fixation device was secured over the back of the screw to provide adequate stabilization of the L5-S1 intervertebral implant.  I was very  pleased with the press fit of the screw.  The wound was then copiously irrigated.  The fascia was then closed using #1 Vicryl.  The subcutaneous layer was closed using 2-0 Vicryl, followed by 4-0 Monocryl.  Benzoin and Steri-Strips were applied, followed by sterile dressing.  The patient was then rolled prone onto a Jackson spinal frame.  All bony prominences were padded and the back was prepped and draped in the usual sterile fashion.  I then made 2 paramedian incisions just lateral to the L5 and S1 pedicles.  Then, on the left side, I did expose the left-sided L5-S1 facet joint and posterolateral gutter,  which was decorticated.  The remainder of the ViviGen was then packed into the region of the facet joint and posterolateral gutter to aid in the success of the fusion.  I then advanced guidewires through the L5 and S1 pedicles on the right and left sides.  A 6 mm tap was then advanced through the L5 and S1 pedicles bilaterally, liberally using AP and lateral fluoroscopy.  I did use triggered EMG to test each of the taps, and there was no tap that tested below 12 milliamps.  I then placed 7 x 40 mm screws into the L5 and S1 pedicles bilaterally.  A 45 mm rod was secured over the tulip heads of the screws.  Caps were then placed and a final locking procedure was performed.  I was very pleased with the final AP and lateral fluoroscopic images and with the purchase of the pedicle screws.  The wounds were then irrigated.  The wounds were then closed using #1 Vicryl, followed by 2-0 Vicryl, followed by 4-0 Monocryl.  Benzoin and Steri-Strips were applied, followed by sterile dressing.  All instrument counts were correct at the termination of the procedure.  Of note, Jason Coop, PA-C, was my assistant throughout surgery, and did aid in retraction, suctioning, and closure from start to finish.  Of note, there was no abnormal EMG activity noted throughout the entire surgery.     Estill Bamberg, MD   ______________________________ Estill Bamberg, MD    MD/MEDQ  D:  01/27/2017  T:  01/27/2017  Job:  [id number]

## 2017-01-28 NOTE — Progress Notes (Signed)
    Patient doing well Moderate expected LBP, minimal abdominal discomfort Patient denies leg pain + numbness throughout left leg, nondermatomal   Physical Exam: Vitals:   01/28/17 0005 01/28/17 0415  BP: (!) 100/45 (!) 105/56  Pulse: 99 80  Resp: 20 17  Temp: 98.9 F (37.2 C) 98.9 F (37.2 C)   Patient looks very comfortable,and is presently up and ambulating Dressing in place NVI  POD #1 s/p L5/S1 A/P fusion, doing well  - up with PT/OT, encourage ambulation - Percocet for pain, Valium for muscle spasms - likely d/c home today depending on PT progress - patient may need a step to help get into bed at home, as it is elevated

## 2017-01-28 NOTE — Op Note (Signed)
    Patient name: Monique Clayton MRN: [id number] DOB: Jan 14, 1966 Sex: female  01/27/2017 Pre-operative Diagnosis: L5-S1 lower back disease Post-operative diagnosis:  Same Surgeon:  Durene CalBrabham, Wells Co-Surgeon:  Estill BambergMark Dumonski Procedure:   Anterior exposure L5-S1 Anesthesia:  Gen. Blood Loss:  See anesthesia record Specimens:  None  Findings:  Normal anatomy  Indications:  The patient was scheduled for anterior instrumentation of the L5-S1 level.  I was asked to provide anterior exposure.  Risks and benefits of this portion of the operation were discussed with the patient through an interpreter in the office.  All questions were answered  Procedure:  The patient was identified in the holding area and [facility]  The patient was then placed supine on the table. general anesthesia was administered.  The patient was prepped and draped in the usual sterile fashion.  A time out was called and antibiotics were administered.  Fluoroscopy was used to confirm the appropriate level to make the skin incision.  A transverse left lower quadrant incision was made.  Cautery was used to divide subcutaneous tissue down to the abdominal wall fascia which was then opened with cautery.  Subfascial flaps were raised.  I also mobilized the medial border of the rectus muscle.  There is a lot of scar tissue at this level from her previous C-sections.  I elected to enter the retroperitoneum from lateral to the rectus muscle.  I identified the iliac artery and vein and then proceeded with blunt dissection to mobilize the peritoneal contents superiorly and medially.  I then exposed the ureter.  This was protected and moved laterally.  With the use of hand-held retractors, I proceeded up towards the L5-S1 disc space.  The median sacral vessels were ligated with cautery.  The medial and lateral sides of the spine were exposed.  The patient's spine was located rather deep and so I used a 200 reversed lip blades once  the Thompson retractor set up.  Once I had the right side of the spine exposed and mobilized the remaining portion of the left iliac vein so that I could get a 150 reversed the blade on the left side of the spine at the level of L5.  Next one none malleable retractors were placed superiorly and inferiorly.  X-ray came back and and to get an additional pictures to confirm that we were at the correct level.  At this point, Dr. Yevette Edwardsumonski began his portion of the procedure.  Please see his operative note for details of the procedure.  He performed closure of the incision.    Disposition:  To PACU stable   V. Durene CalWells Mattison Golay, M.D. [facility] Office: [phone] Pager:  [phone]

## 2017-01-29 ENCOUNTER — Encounter (HOSPITAL_COMMUNITY): Payer: Self-pay | Admitting: Orthopedic Surgery

## 2017-01-29 LAB — GLUCOSE, CAPILLARY
Glucose-Capillary: 198 mg/dL — ABNORMAL HIGH (ref 65–99)
Glucose-Capillary: 164 mg/dL — ABNORMAL HIGH (ref 65–99)

## 2017-01-29 MED ORDER — LIVING WELL WITH DIABETES BOOK - IN SPANISH
Freq: Once | Status: DC
  Filled 2017-01-29: qty 1

## 2017-01-29 NOTE — Progress Notes (Signed)
Physical Therapy Treatment Patient Details Name: Monique Clayton MRN: [id number] DOB: 01-31-1966 Today's Date: 01/29/2017    History of Present Illness Pt is a 51 y/o female who presents s/p L5-S1 ALIF on 01/27/17.    PT Comments    Patient progressing well towards PT goals. Tolerated stair training with Mod A for balance/safety. Son present and did family training to be able to assist her on steps. Discussed getting in/out of car using step stool and technique. Able to recall 2/3 back precautions. Reinforced following precautions during mobility. Reports some dizziness during mobility which has been constant yesterday and today but eventually resolves. Plans to d/c home today.   Follow Up Recommendations  No PT follow up;Supervision for mobility/OOB     Equipment Recommendations  Rolling walker with 5" wheels;3in1 (PT) (step stool for bed /car)    Recommendations for Other Services       Precautions / Restrictions Precautions Precautions: Fall;Back Precaution Booklet Issued: Yes (comment) Precaution Comments: Reviewed back precautions. Required Braces or Orthoses: Spinal Brace Spinal Brace: Thoracolumbosacral orthotic;Applied in sitting position Restrictions Weight Bearing Restrictions: No    Mobility  Bed Mobility Overal bed mobility: Needs Assistance Bed Mobility: Rolling;Sidelying to Sit Rolling: Supervision Sidelying to sit: Supervision       General bed mobility comments: HOB flat, and rail removed to stimulate home. Discussed need for step stool to get into bed. Good log roll technique.  Transfers Overall transfer level: Needs assistance Equipment used: Rolling walker (2 wheeled) Transfers: Sit to/from Stand Sit to Stand: Supervision         General transfer comment: Supervision for safety.   Ambulation/Gait Ambulation/Gait assistance: Min guard Ambulation Distance (Feet): 250 Feet Assistive device: Rolling walker (2 wheeled) Gait  Pattern/deviations: Step-through pattern;Decreased stride length Gait velocity: Decreased Gait velocity interpretation: Below normal speed for age/gender General Gait Details: Slow, steady gait; adjusted RW for better fit. A few standing rest breaks. Numbness reported LLE.   Stairs Stairs: Yes   Stair Management: One rail Right Number of Stairs: 3 General stair comments: Cues for technique/safety using hand held assist and rail for support. Difficulty ascending steps.  Wheelchair Mobility    Modified Rankin (Stroke Patients Only)       Balance Overall balance assessment: Needs assistance Sitting-balance support: Feet supported;No upper extremity supported Sitting balance-Leahy Scale: Good     Standing balance support: During functional activity;Bilateral upper extremity supported Standing balance-Leahy Scale: Poor Standing balance comment: Required UE support to maintain balance during dynamic standing.                            Cognition Arousal/Alertness: Awake/alert Behavior During Therapy: WFL for tasks assessed/performed Overall Cognitive Status: Within Functional Limits for tasks assessed                                        Exercises      General Comments        Pertinent Vitals/Pain Pain Assessment: 0-10 Pain Score: 5  Pain Location: Incision site Pain Descriptors / Indicators: Operative site guarding;Discomfort;Grimacing Pain Intervention(s): Monitored during session;Repositioned;Limited activity within patient's tolerance    Home Living                      Prior Function            PT Goals (  current goals can now be found in the care plan section) Progress towards PT goals: Progressing toward goals    Frequency    Min 5X/week      PT Plan Current plan remains appropriate    Co-evaluation              AM-PAC PT "6 Clicks" Daily Activity  Outcome Measure  Difficulty turning over in bed  (including adjusting bedclothes, sheets and blankets)?: None Difficulty moving from lying on back to sitting on the side of the bed? : None Difficulty sitting down on and standing up from a chair with arms (e.g., wheelchair, bedside commode, etc,.)?: None Help needed moving to and from a bed to chair (including a wheelchair)?: A Little Help needed walking in hospital room?: A Little Help needed climbing 3-5 steps with a railing? : A Lot 6 Click Score: 20    End of Session Equipment Utilized During Treatment: Back brace Activity Tolerance: Patient tolerated treatment well Patient left: in bed;with call bell/phone within reach;with family/visitor present Nurse Communication: Mobility status PT Visit Diagnosis: Unsteadiness on feet (R26.81);Pain Pain - part of body:  (back)     Time: [id number] PT Time Calculation (min) (ACUTE ONLY): 33 min  Charges:  $Gait Training: 23-37 mins                    G Codes:       Mylo RedShauna Navil Kole, PT, DPT [phone]     Blake DivineShauna A Mckinzie Saksa 01/29/2017, 8:29 AM

## 2017-01-29 NOTE — Progress Notes (Signed)
Occupational Therapy Treatment Patient Details Name: Monique Clayton MRN: [id number] DOB: 1966-08-04 Today's Date: 01/29/2017    History of present illness Pt is a 51 y/o female who presents s/p L5-S1 ALIF on 01/27/17.   OT comments  Pt able to perform functional mobility today with supervision. Able to don brace with set up and supervision, min assist for LB dressing with use of AE. Educated on use of reacher, sock aide, shoe horn, and long handled sponge. D/c plan remains appropriate. Will continue to follow acutely.   Follow Up Recommendations  No OT follow up;Supervision - Intermittent    Equipment Recommendations  3 in 1 bedside commode;Other (comment) (Adaptive equipment, RW 2 wheeled)    Recommendations for Other Services      Precautions / Restrictions Precautions Precautions: Fall;Back Precaution Booklet Issued: No Precaution Comments: Reviewed back precautions. Required Braces or Orthoses: Spinal Brace Spinal Brace: Thoracolumbosacral orthotic;Applied in sitting position Restrictions Weight Bearing Restrictions: No       Mobility Bed Mobility Overal bed mobility: Needs Assistance Bed Mobility: Rolling;Sidelying to Sit Rolling: Supervision Sidelying to sit: Supervision       General bed mobility comments: HOB flat. Supervision for safety; no cues needed  Transfers Overall transfer level: Needs assistance Equipment used: Rolling walker (2 wheeled) Transfers: Sit to/from Stand Sit to Stand: Supervision         General transfer comment: Supervision for safety. Good hand placement, increased time required    Balance Overall balance assessment: Needs assistance Sitting-balance support: Feet supported;No upper extremity supported Sitting balance-Leahy Scale: Good     Standing balance support: No upper extremity supported;During functional activity Standing balance-Leahy Scale: Fair Standing balance comment: Required UE support to maintain balance  during dynamic standing.                           ADL either performed or assessed with clinical judgement   ADL Overall ADL's : Needs assistance/impaired                 Upper Body Dressing : Set up;Supervision/safety;Sitting Upper Body Dressing Details (indicate cue type and reason): for donning shirt and brace Lower Body Dressing: Minimal assistance;Sit to/from stand;With adaptive equipment Lower Body Dressing Details (indicate cue type and reason): to don underwear and pants Toilet Transfer: Supervision/safety;Ambulation;RW Toilet Transfer Details (indicate cue type and reason): Simulated by sit to stand from EOB with functional mobility in room         Functional mobility during ADLs: Supervision/safety;Rolling walker General ADL Comments: Educated pt on use of AE for increased independence with LB ADL; pt able to return demo use of reacher and sock aide, verbalized understanding of long handled sponge and shoe horn.     Vision       Perception     Praxis      Cognition Arousal/Alertness: Awake/alert Behavior During Therapy: WFL for tasks assessed/performed Overall Cognitive Status: Within Functional Limits for tasks assessed                                          Exercises     Shoulder Instructions       General Comments      Pertinent Vitals/ Pain       Pain Assessment: 0-10 Pain Score: 5  Pain Location: back Pain Descriptors / Indicators: Aching;Sore Pain Intervention(s): Monitored  during session;Repositioned  Home Living                                          Prior Functioning/Environment              Frequency  Min 2X/week        Progress Toward Goals  OT Goals(current goals can now be found in the care plan section)  Progress towards OT goals: Progressing toward goals  Acute Rehab OT Goals Patient Stated Goal: home today OT Goal Formulation: With patient/family  Plan  Discharge plan remains appropriate    Co-evaluation                 AM-PAC PT "6 Clicks" Daily Activity     Outcome Measure   Help from another person eating meals?: None Help from another person taking care of personal grooming?: A Little Help from another person toileting, which includes using toliet, bedpan, or urinal?: A Little Help from another person bathing (including washing, rinsing, drying)?: A Little Help from another person to put on and taking off regular upper body clothing?: A Little Help from another person to put on and taking off regular lower body clothing?: A Little 6 Click Score: 19    End of Session Equipment Utilized During Treatment: Rolling walker;Back brace  OT Visit Diagnosis: Other abnormalities of gait and mobility (R26.89);Pain   Activity Tolerance Patient tolerated treatment well   Patient Left in chair;with call bell/phone within reach;with family/visitor present   Nurse Communication Mobility status        Time: [id number] OT Time Calculation (min): 27 min  Charges: OT General Charges $OT Visit: 1 Procedure OT Treatments $Self Care/Home Management : 23-37 mins  Aleasha Fregeau A. Brett Albinooffey, M.S., OTR/L Pager: [phone]   Gaye AlkenBailey A Maebel Marasco 01/29/2017, 10:58 AM

## 2017-01-29 NOTE — Care Management (Signed)
Case manager has explained to patient and her son, after speaking with her Worker's comp CM- [employer] EnergySheryl Long, that adaptive equipment  And step stool will be shipped to her house by a vendor arranged by worker's comp. RW and 3in1 have been authroized and have been delivered to patient's room. She is ready for discharge,.

## 2017-01-29 NOTE — Care Management Note (Signed)
Case Management Note  Patient Details  Name: Nathanial RancherMarcelina G Blankley MRN: [id number] Date of Birth: 07-Mar-1966  Subjective/Objective:                    Action/Plan:   Expected Discharge Date:  01/29/17               Expected Discharge Plan:  Home/Self Care  In-House Referral:  NA  Discharge planning Services  CM Consult  Post Acute Care Choice:  Durable Medical Equipment Choice offered to:     DME Arranged:  Walker rolling, 3-N-1 DME Agency:  [facility]  HH Arranged:  NA HH Agency:  NA  Status of Service:  Completed, signed off  If discussed at Long Length of Stay Meetings, dates discussed:    Additional Comments:  Durenda GuthrieBrady, Dura Mccormack Naomi, RN 01/29/2017, 2:12 PM

## 2017-01-29 NOTE — Progress Notes (Signed)
    Patient doing well Staying an additional day for additional PT and OT Pain controlled with meds   Physical Exam: Vitals:   01/29/17 0018 01/29/17 0403  BP: 113/63 116/73  Pulse: 86 81  Resp: 18 20  Temp: 98.7 F (37.1 C) 99.1 F (37.3 C)    Dressing in place NVI  POD #2 s/p L5/S1 A/P fusion, doing well  - up with PT/OT, encourage ambulation - Percocet for pain, Valium for muscle spasms - d/c home today

## 2017-01-29 NOTE — Progress Notes (Signed)
Patient is discharged from room 3C09 at this time. Alert and in stable condition. IV site d/c'd and instructions given in both spanish and English to patient and son with understanding verbalized. Left unit via wheelchair with all belongings at side.

## 2017-02-07 ENCOUNTER — Emergency Department (HOSPITAL_COMMUNITY)
Admission: EM | Admit: 2017-02-07 | Discharge: 2017-02-07 | Disposition: A | Discharge status: Home / Self Care | Payer: Worker's Compensation | Attending: Emergency Medicine | Admitting: Emergency Medicine

## 2017-02-07 ENCOUNTER — Emergency Department (HOSPITAL_BASED_OUTPATIENT_CLINIC_OR_DEPARTMENT_OTHER)
Admission: RE | Admit: 2017-02-07 | Discharge: 2017-02-07 | Disposition: A | Discharge status: Home / Self Care | Payer: Worker's Compensation | Source: Ambulatory Visit | Attending: Emergency Medicine | Admitting: Emergency Medicine

## 2017-02-07 ENCOUNTER — Encounter (HOSPITAL_COMMUNITY): Payer: Self-pay | Admitting: Emergency Medicine

## 2017-02-07 DIAGNOSIS — Z91040 Latex allergy status: Secondary | ICD-10-CM | POA: Insufficient documentation

## 2017-02-07 DIAGNOSIS — E119 Type 2 diabetes mellitus without complications: Secondary | ICD-10-CM | POA: Insufficient documentation

## 2017-02-07 DIAGNOSIS — Z7984 Long term (current) use of oral hypoglycemic drugs: Secondary | ICD-10-CM | POA: Insufficient documentation

## 2017-02-07 DIAGNOSIS — M79609 Pain in unspecified limb: Secondary | ICD-10-CM

## 2017-02-07 DIAGNOSIS — Z79899 Other long term (current) drug therapy: Secondary | ICD-10-CM | POA: Insufficient documentation

## 2017-02-07 DIAGNOSIS — I10 Essential (primary) hypertension: Secondary | ICD-10-CM | POA: Insufficient documentation

## 2017-02-07 DIAGNOSIS — G8918 Other acute postprocedural pain: Secondary | ICD-10-CM

## 2017-02-07 MED ORDER — KETOROLAC TROMETHAMINE 15 MG/ML IJ SOLN
30.0000 mg | Freq: Once | INTRAMUSCULAR | Status: AC
  Administered 2017-02-07: 30 mg via INTRAMUSCULAR
  Filled 2017-02-07: qty 2

## 2017-02-07 MED ORDER — IBUPROFEN 600 MG PO TABS: 600.0000 mg | ORAL_TABLET | Freq: Four times a day (QID) | ORAL | 0 refills | Status: AC | PRN

## 2017-02-07 MED ORDER — HYDROMORPHONE HCL 1 MG/ML IJ SOLN
1.0000 mg | Freq: Once | INTRAMUSCULAR | Status: AC
  Administered 2017-02-07: 1 mg via INTRAMUSCULAR
  Filled 2017-02-07: qty 1

## 2017-02-07 MED ORDER — DIAZEPAM 5 MG PO TABS
5.0000 mg | ORAL_TABLET | Freq: Once | ORAL | Status: AC
  Administered 2017-02-07: 5 mg via ORAL
  Filled 2017-02-07: qty 1

## 2017-02-07 NOTE — ED Triage Notes (Signed)
Patient reports persistent low back pain at surgical site ( Lumbar Fusion 01/27/2017) unrelieved by prescription pain  medications.

## 2017-02-07 NOTE — Progress Notes (Signed)
VASCULAR LAB PRELIMINARY  PRELIMINARY  PRELIMINARY  PRELIMINARY  Left lower extremity venous duplex completed.    Preliminary report:  There is no DVT or SVT noted in the left lower extremity.   Called report to Dr. Donato HeinzKohut  Monique Clayton, Monique Clayton & HospitalCANDACE, RVT 02/07/2017, 10:08 AM

## 2017-02-07 NOTE — ED Provider Notes (Signed)
[facility]-EMERGENCY DEPT Provider Note   CSN: [id number] Arrival date & time: 02/07/17  4132     History   Chief Complaint Chief Complaint  Patient presents with  . Post Op Pain    HPI Monique Clayton is a 51 y.o. female.  HPI   51yF with lower back pain. Lumbar surgery on 5/16. Pain had been reasonably controlled until ran out of meds. No fever or chills. No acute neuro complaints. Has had some waxing/waning swelling of LLE. No calf pain. No respiratory complaints.   Past Medical History:  Diagnosis Date  . Allergy    takes Zyrtec daily and uses Eye Drops  . Anemia   . Arthritis   . Depression    hx of-was on meds d/t death of dad in 02/05/2013  . Diabetes mellitus without complication (HCC)    takes Meds daily  . Hyperlipidemia    was on Lipitor but has been off for a yr  . Hypertension    was on Losartan but has been off for over a yr  . Joint pain   . Urinary frequency     Patient Active Problem List   Diagnosis Date Noted  . Radiculopathy 01/27/2017    Past Surgical History:  Procedure Laterality Date  . ABDOMINAL EXPOSURE N/A 01/27/2017   Procedure: ABDOMINAL EXPOSURE;  Surgeon: Nada Libman, MD;  Location: [facility] OR;  Service: Vascular;  Laterality: N/A;  . ANTERIOR LUMBAR FUSION N/A 01/27/2017   Procedure: LUMBAR 5-SACRUM 1 ANTERIOR LUMBAR INTERBODY FUSION;  Surgeon: Estill Bamberg, MD;  Location: [facility] OR;  Service: Orthopedics;  Laterality: N/A;  LUMBAR 5-SACRUM 1 ANTERIOR LUMBAR INTERBODY FUSION   . CESAREAN SECTION     x 2  . KNEE SURGERY Left     OB History    No data available       Home Medications    Prior to Admission medications   Medication Sig Start Date End Date Taking? Authorizing Provider  azelastine (OPTIVAR) 0.05 % ophthalmic solution Place 1 drop into both eyes 2 (two) times daily.    [provider]  Dulaglutide (TRULICITY) 1.5 MG/0.5ML SOPN INJECT 1.5 MG UNDER THE SKIN ONCE WEEKLY FOR 30 DAYS 09/25/16   [provider]  glipiZIDE-metformin (METAGLIP) 2.5-500 MG tablet TAKE 2 TABLETS BY MOUTH EVERY MORNING AND TAKE 1 TABLET EVERY EVENING 12/18/16   [provider]  montelukast (SINGULAIR) 10 MG tablet Take 10 mg by mouth at bedtime.    [provider]  pioglitazone (ACTOS) 15 MG tablet Take 15 mg by mouth every morning.  12/04/16   [provider]  sitaGLIPtin (JANUVIA) 100 MG tablet Take 100 mg by mouth daily.  06/09/16   [provider]    Family History No family history on file.  Social History Social History  Substance Use Topics  . Smoking status: Never Smoker  . Smokeless tobacco: Never Used  . Alcohol use No     Allergies   Ace inhibitors; Latex; and Canagliflozin   Review of Systems Review of Systems  All systems reviewed and negative, other than as noted in HPI.   Physical Exam Updated Vital Signs BP 101/63 (BP Location: Left Arm)   Pulse 91   Temp 98.9 F (37.2 C) (Oral)   Resp 16   Ht 5\' 1"  (1.549 m)   Wt 77.1 kg (170 lb)   SpO2 99%   BMI 32.12 kg/m   Physical Exam  Constitutional: She appears well-developed and well-nourished.  No distress.  HENT:  Head: Normocephalic and atraumatic.  Eyes: Conjunctivae are normal. Right eye exhibits no discharge. Left eye exhibits no discharge.  Neck: Neck supple.  Cardiovascular: Normal rate, regular rhythm and normal heart sounds.  Exam reveals no gallop and no friction rub.   No murmur heard. Pulmonary/Chest: Effort normal and breath sounds normal. No respiratory distress.  Abdominal: Soft. She exhibits no distension. There is no tenderness.  Musculoskeletal: She exhibits no edema or tenderness.  Mild swelling LLE. No calf tenderness.   Neurological: She is alert.  Able to get up from wheelchair and transfer herself onto stretcher. Strength 5/5 b/l LE. Sensation intact to light touch.   Skin: Skin is warm and dry.  Anterior and posterior surgical sites appear to be healing w/o complication.     Psychiatric: She has a normal mood and affect. Her behavior is normal. Thought content normal.  Nursing note and vitals reviewed.    ED Treatments / Results  Labs (all labs ordered are listed, but only abnormal results are displayed) Labs Reviewed - No data to display  EKG  EKG Interpretation None       Radiology No results found.  Procedures Procedures (including critical care time)  Medications Ordered in ED Medications  HYDROmorphone (DILAUDID) injection 1 mg (not administered)  ketorolac (TORADOL) 15 MG/ML injection 30 mg (not administered)  diazepam (VALIUM) tablet 5 mg (not administered)     Initial Impression / Assessment and Plan / ED Course  I have reviewed the triage vital signs and the nursing notes.  Pertinent labs & imaging results that were available during my care of the patient were reviewed by me and considered in my medical decision making (see chart for details).     51yF with post-op pain. L5/S1 fusion on 01/27/17.   Her abdominal and back incisions appear to be healing well. There are no concerning skin changes here. Incisional tenderness seems appropriate for the timing of her procedure.   She has no acute neurologic complaints aside from some intermittent numbness in her left foot. She is neurologically intact on exam though. She does have some swelling there. This may be related to the procedure (anterior exposure was done on the left side), but will ultrasound to evaluate for possible DVT.   Her pain was reasonably controlled on oxycodone, but she is currently out. She reports she has a prescription she can pick up in the office on Tuesday and "something else less strong" that she was prescribed but has yet to pick up form the pharmacy. Will get her pain under control in the ED.  I don't see any obvious contraindications to NSAIDs and advised that I think she would probably get better pain relief if she took them concurrently with her other  medications.   Final Clinical Impressions(s) / ED Diagnoses   Final diagnoses:  Post-op pain    New Prescriptions New Prescriptions   No medications on file     Raeford RazorKohut, Jeric Slagel, MD 02/14/17 [id number]

## 2017-02-09 NOTE — Discharge Summary (Signed)
Patient ID: Monique Clayton MRN: [id number] DOB/AGE: [birth year] 51 y.o.  Admit date: 01/27/2017 Discharge date: 01/29/2017  Admission Diagnoses:  Active Problems:   Radiculopathy   Discharge Diagnoses:  Same  Past Medical History:  Diagnosis Date  . Allergy    takes Zyrtec daily and uses Eye Drops  . Anemia   . Arthritis   . Depression    hx of-was on meds d/t death of dad in 2014  . Diabetes mellitus without complication (HCC)    takes Meds daily  . Hyperlipidemia    was on Lipitor but has been off for a yr  . Hypertension    was on Losartan but has been off for over a yr  . Joint pain   . Urinary frequency     Surgeries: Procedure(s): LUMBAR 5-SACRUM 1 ANTERIOR LUMBAR INTERBODY FUSION LUMBAR 5-SACRUM 1 POSTERIOR LUMBAR FUSION WITH INSTRUMENTATION AND ALLOGRAFT ABDOMINAL EXPOSURE on 01/27/2017   Consultants: Treatment Team:  Nada LibmanBrabham, Vance W, MD  Discharged Condition: Improved  Hospital Course: Monique Clayton is an 51 y.o. female who was admitted 01/27/2017 for operative treatment of radiculopathy. Patient has severe unremitting pain that affects sleep, daily activities, and work/hobbies. After pre-op clearance the patient was taken to the operating room on 01/27/2017 and underwent  Procedure(s): LUMBAR 5-SACRUM 1 ANTERIOR LUMBAR INTERBODY FUSION LUMBAR 5-SACRUM 1 POSTERIOR LUMBAR FUSION WITH INSTRUMENTATION AND ALLOGRAFT ABDOMINAL EXPOSURE.    Patient was given perioperative antibiotics:  Anti-infectives    Start     Dose/Rate Route Frequency Ordered Stop   01/27/17 1700  ceFAZolin (ANCEF) IVPB 2g/100 mL premix     2 g 200 mL/hr over 30 Minutes Intravenous Every 8 hours 01/27/17 1654 01/28/17 0045   01/27/17 0635  ceFAZolin (ANCEF) IVPB 2g/100 mL premix     2 g 200 mL/hr over 30 Minutes Intravenous On call to O.R. 01/27/17 [id number] 01/27/17 1245       Patient was given sequential compression devices, early ambulation to prevent DVT.  Patient  benefited maximally from hospital stay and there were no complications.    Recent vital signs: BP 126/75 (BP Location: Right Arm)   Pulse (!) 102   Temp 99.2 F (37.3 C) (Oral)   Resp 18   Ht 5' (1.524 m)   Wt 80.6 kg (177 lb 9.6 oz)   SpO2 100%   BMI 34.69 kg/m   Discharge Medications:   Allergies as of 01/29/2017      Reactions   Ace Inhibitors Cough   Latex Swelling, Rash   Rash.Hand swelling   Canagliflozin Other (See Comments)   Yeast Infection       Medication List    TAKE these medications   azelastine 0.05 % ophthalmic solution Commonly known as:  OPTIVAR Place 1 drop into both eyes 2 (two) times daily.   glipiZIDE-metformin 2.5-500 MG tablet Commonly known as:  METAGLIP TAKE 2 TABLETS BY MOUTH EVERY MORNING AND TAKE 1 TABLET EVERY EVENING   pioglitazone 15 MG tablet Commonly known as:  ACTOS Take 15 mg by mouth every morning.   SINGULAIR 10 MG tablet Generic drug:  montelukast Take 10 mg by mouth at bedtime.   sitaGLIPtin 100 MG tablet Commonly known as:  JANUVIA Take 100 mg by mouth daily.   TRULICITY 1.5 MG/0.5ML Sopn Generic drug:  Dulaglutide INJECT 1.5 MG UNDER THE SKIN EVERY FRIDAY       Diagnostic Studies: Dg Chest 2 View  Result Date: 01/21/2017 CLINICAL DATA:  Pre-op  respiratory exam for lumbar spine fusion. Diabetes and hypertension. EXAM: CHEST  2 VIEW COMPARISON:  10/13/2009 FINDINGS: The heart size and mediastinal contours are within normal limits. Both lungs are clear. The visualized skeletal structures are unremarkable. IMPRESSION: Negative.  No active cardiopulmonary disease. Electronically Signed   By: Myles Rosenthal M.D.   On: 01/21/2017 16:38   Dg Lumbar Spine 2-3 Views  Result Date: 01/27/2017 CLINICAL DATA:  Lumbar spine fusion. EXAM: DG C-ARM 61-120 MIN; LUMBAR SPINE - 2-3 VIEW COMPARISON:  Abdominal radiograph earlier today FLUOROSCOPY TIME:  C-arm fluoroscopic images were obtained intraoperatively and submitted for post operative  interpretation. Please see the performing provider's procedural report for the fluoroscopy time utilized. FINDINGS: Two intraoperative spot fluoroscopic images of the lower lumbar spine are provided. An anterior screw and washer remain in place at S1, and an interbody implant is again seen at L5-S1. Posterior fusion has been performed at L5-S1 with placement of bilateral pedicle screws at each level. IMPRESSION: Intraoperative images during L5-S1 fusion. Electronically Signed   By: Sebastian Ache M.D.   On: 01/27/2017 13:02   Dg C-arm 61-120 Min  Result Date: 01/27/2017 CLINICAL DATA:  Lumbar spine fusion. EXAM: DG C-ARM 61-120 MIN; LUMBAR SPINE - 2-3 VIEW COMPARISON:  Abdominal radiograph earlier today FLUOROSCOPY TIME:  C-arm fluoroscopic images were obtained intraoperatively and submitted for post operative interpretation. Please see the performing provider's procedural report for the fluoroscopy time utilized. FINDINGS: Two intraoperative spot fluoroscopic images of the lower lumbar spine are provided. An anterior screw and washer remain in place at S1, and an interbody implant is again seen at L5-S1. Posterior fusion has been performed at L5-S1 with placement of bilateral pedicle screws at each level. IMPRESSION: Intraoperative images during L5-S1 fusion. Electronically Signed   By: Sebastian Ache M.D.   On: 01/27/2017 13:02   Dg Or Local Abdomen  Result Date: 01/27/2017 CLINICAL DATA:  Anterior lower lumbar fixation EXAM: OR LOCAL ABDOMEN COMPARISON:  None. FINDINGS: Supine image obtained. There is a screw at S1 with small radiopaque foreign bodies, likely disc spacer. There is a surgical clip in the periphery of the left pelvis. Bowel gas pattern normal. Moderate stool in colon. There is air in the pelvis which appears extra peritoneal, likely of postoperative etiology. IMPRESSION: Screw at S1 level with small rounded metallic foreign bodies, likely of postoperative etiology. Presumed small clip lateral  left pelvis. No retained needle, sponge, or instrument seen. Extraperitoneal air in left pelvis, likely of postoperative etiology. Critical Value/emergent results were called by telephone at the time of interpretation on 01/27/2017 at 11:47 am to [facility], RN, who verbally acknowledged these results. Electronically Signed   By: Bretta Bang III M.D.   On: 01/27/2017 11:48    Disposition: 01-Home or Self Care   POD #2 s/p L5/S1 A/P fusion, doing well  - up with PT/OT, encourage ambulation - Percocet for pain, Valium for muscle spasms -Written scripts for pain signed and in chart -D/C instructions sheet printed and in chart -D/C today  -F/U in office 2 weeks   Signed: Georga Bora 02/09/2017, 12:58 PM

## 2019-01-16 IMAGING — CR DG OR LOCAL ABDOMEN
1 series · 1 of 1 positions shown · non-contrast
Comparison: None.

CLINICAL DATA: Anterior lower lumbar fixation

EXAM:
OR LOCAL ABDOMEN

[AP]
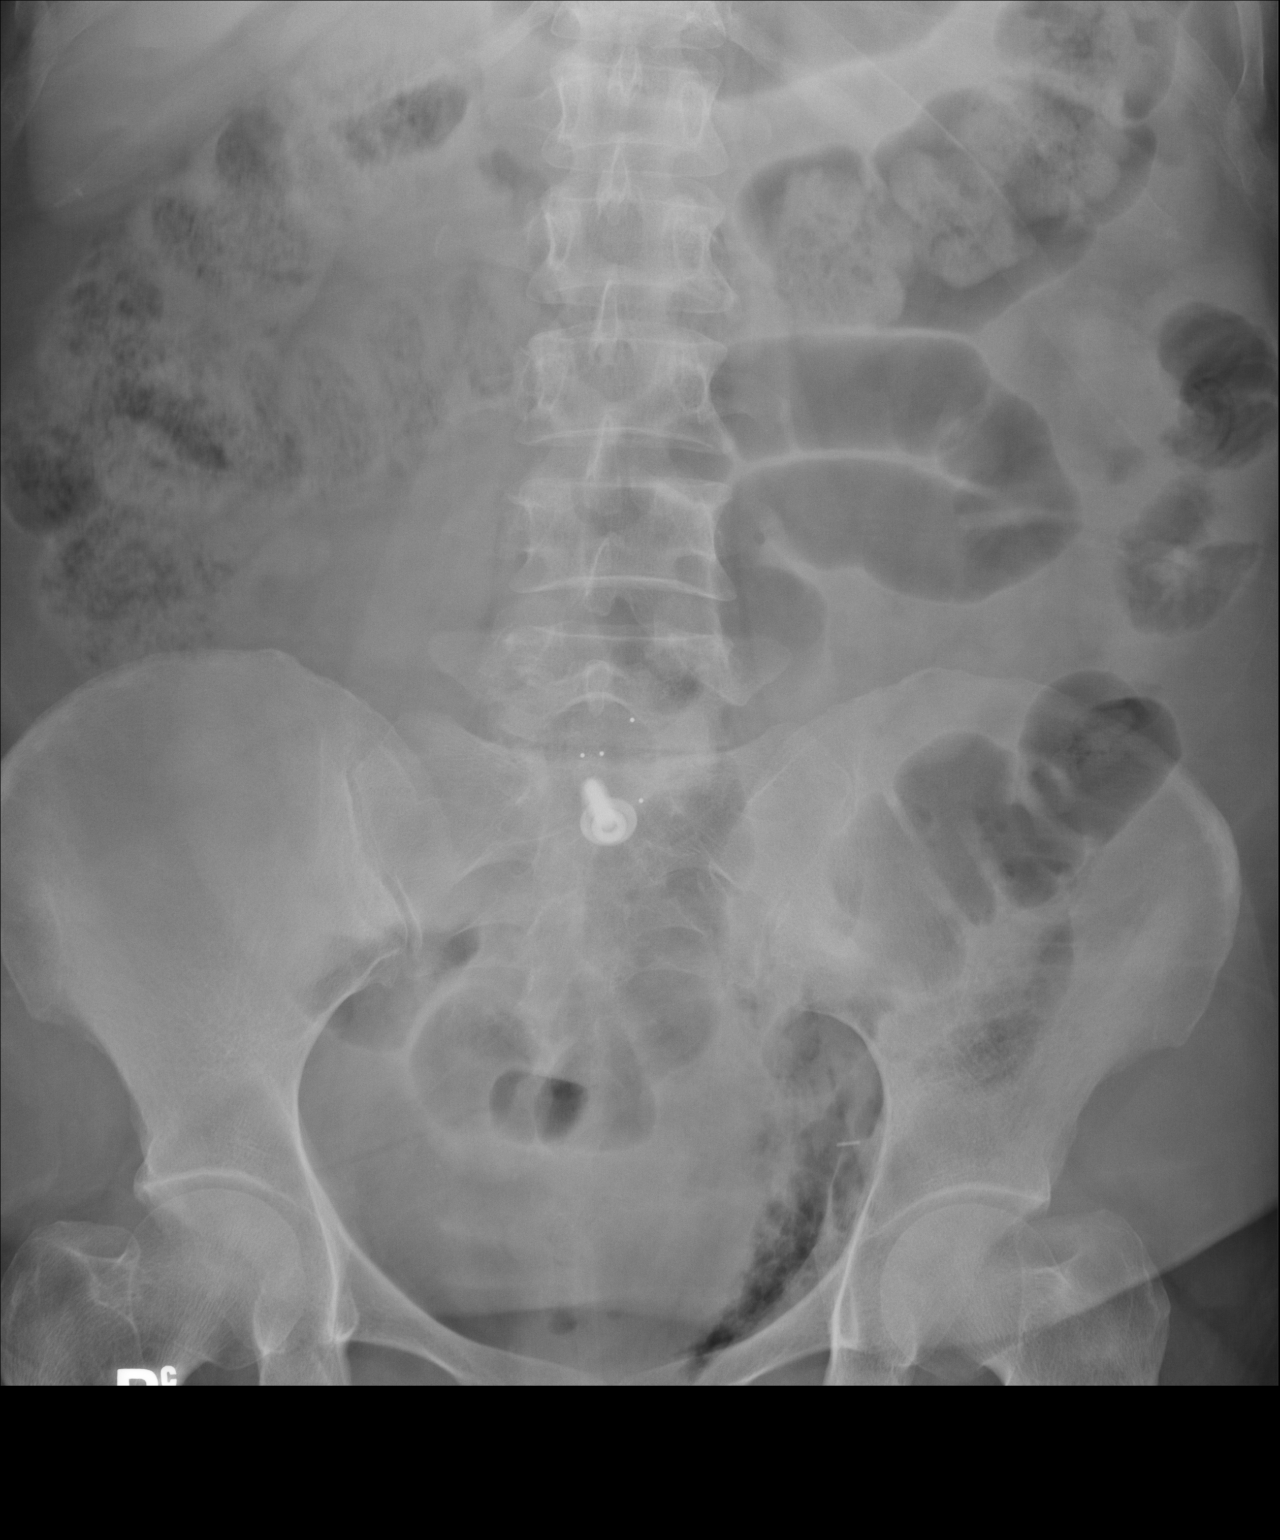

[1 of 1 positions shown; findings below may reference images not displayed]

FINDINGS: Supine image obtained. There is a screw at S1 with small radiopaque
foreign bodies, likely disc spacer. There is a surgical clip in the
periphery of the left pelvis. Bowel gas pattern normal. Moderate
stool in colon. There is air in the pelvis which appears extra
peritoneal, likely of postoperative etiology.
IMPRESSION: Screw at S1 level with small rounded metallic foreign bodies, likely
of postoperative etiology. Presumed small clip lateral left pelvis.
No retained needle, sponge, or instrument seen. Extraperitoneal air
in left pelvis, likely of postoperative etiology.

Critical Value/emergent results were called by telephone at the time
of interpretation on 01/27/2017 at [DATE] to Tsonis Kakari, RN, who
verbally acknowledged these results.
